# Patient Record
Sex: Male | Born: 1940 | Race: White | Marital: Married | Smoking: Never smoker
Health system: Southern US, Community
[De-identification: ages and names within clinical notes are randomized; demographics above are authoritative.]

## PROBLEM LIST (undated history)

## (undated) DIAGNOSIS — B009 Herpesviral infection, unspecified: Secondary | ICD-10-CM

## (undated) DIAGNOSIS — K219 Gastro-esophageal reflux disease without esophagitis: Secondary | ICD-10-CM

## (undated) DIAGNOSIS — K382 Diverticulum of appendix: Secondary | ICD-10-CM

## (undated) DIAGNOSIS — R911 Solitary pulmonary nodule: Secondary | ICD-10-CM

## (undated) DIAGNOSIS — K648 Other hemorrhoids: Secondary | ICD-10-CM

## (undated) DIAGNOSIS — E871 Hypo-osmolality and hyponatremia: Secondary | ICD-10-CM

## (undated) DIAGNOSIS — I2510 Atherosclerotic heart disease of native coronary artery without angina pectoris: Secondary | ICD-10-CM

## (undated) DIAGNOSIS — K2980 Duodenitis without bleeding: Secondary | ICD-10-CM

## (undated) DIAGNOSIS — R7301 Impaired fasting glucose: Secondary | ICD-10-CM

## (undated) DIAGNOSIS — E559 Vitamin D deficiency, unspecified: Secondary | ICD-10-CM

## (undated) DIAGNOSIS — Z8546 Personal history of malignant neoplasm of prostate: Secondary | ICD-10-CM

## (undated) DIAGNOSIS — C61 Malignant neoplasm of prostate: Secondary | ICD-10-CM

## (undated) DIAGNOSIS — K319 Disease of stomach and duodenum, unspecified: Secondary | ICD-10-CM

## (undated) DIAGNOSIS — E785 Hyperlipidemia, unspecified: Secondary | ICD-10-CM

## (undated) DIAGNOSIS — F419 Anxiety disorder, unspecified: Secondary | ICD-10-CM

## (undated) DIAGNOSIS — I10 Essential (primary) hypertension: Secondary | ICD-10-CM

## (undated) DIAGNOSIS — E039 Hypothyroidism, unspecified: Secondary | ICD-10-CM

## (undated) DIAGNOSIS — K5080 Crohn's disease of both small and large intestine without complications: Secondary | ICD-10-CM

## (undated) DIAGNOSIS — R2681 Unsteadiness on feet: Secondary | ICD-10-CM

## (undated) HISTORY — PX: PROSTATE BIOPSY: SHX241

## (undated) HISTORY — DX: Impaired fasting glucose: R73.01

## (undated) HISTORY — DX: Hypo-osmolality and hyponatremia: E87.1

## (undated) HISTORY — DX: Other hemorrhoids: K64.8

## (undated) HISTORY — DX: Herpesviral infection, unspecified: B00.9

## (undated) HISTORY — DX: Unsteadiness on feet: R26.81

## (undated) HISTORY — DX: Diverticulum of appendix: K38.2

## (undated) HISTORY — DX: Gastro-esophageal reflux disease without esophagitis: K21.9

## (undated) HISTORY — DX: Solitary pulmonary nodule: R91.1

## (undated) HISTORY — DX: Duodenitis without bleeding: K29.80

## (undated) HISTORY — DX: Atherosclerotic heart disease of native coronary artery without angina pectoris: I25.10

## (undated) HISTORY — PX: TONSILLECTOMY: SUR1361

## (undated) HISTORY — DX: Vitamin D deficiency, unspecified: E55.9

## (undated) HISTORY — DX: Disease of stomach and duodenum, unspecified: K31.9

## (undated) HISTORY — PX: PROSTATECTOMY: SHX69

## (undated) HISTORY — DX: Anxiety disorder, unspecified: F41.9

## (undated) HISTORY — DX: Crohn's disease of both small and large intestine without complications: K50.80

## (undated) HISTORY — DX: Essential (primary) hypertension: I10

## (undated) HISTORY — DX: Personal history of malignant neoplasm of prostate: Z85.46

## (undated) HISTORY — DX: Malignant neoplasm of prostate: C61

## (undated) HISTORY — DX: Hyperlipidemia, unspecified: E78.5

---

## 2000-01-29 DIAGNOSIS — K5080 Crohn's disease of both small and large intestine without complications: Secondary | ICD-10-CM

## 2000-01-29 HISTORY — DX: Crohn's disease of both small and large intestine without complications: K50.80

## 2014-05-16 DIAGNOSIS — H2513 Age-related nuclear cataract, bilateral: Secondary | ICD-10-CM | POA: Diagnosis not present

## 2014-05-16 DIAGNOSIS — H40003 Preglaucoma, unspecified, bilateral: Secondary | ICD-10-CM | POA: Diagnosis not present

## 2014-05-16 DIAGNOSIS — H524 Presbyopia: Secondary | ICD-10-CM | POA: Diagnosis not present

## 2014-11-18 DIAGNOSIS — E782 Mixed hyperlipidemia: Secondary | ICD-10-CM | POA: Diagnosis not present

## 2014-11-18 DIAGNOSIS — Z79899 Other long term (current) drug therapy: Secondary | ICD-10-CM | POA: Diagnosis not present

## 2014-11-18 DIAGNOSIS — R5383 Other fatigue: Secondary | ICD-10-CM | POA: Diagnosis not present

## 2014-11-18 DIAGNOSIS — E559 Vitamin D deficiency, unspecified: Secondary | ICD-10-CM | POA: Diagnosis not present

## 2014-11-18 DIAGNOSIS — Z125 Encounter for screening for malignant neoplasm of prostate: Secondary | ICD-10-CM | POA: Diagnosis not present

## 2014-11-18 DIAGNOSIS — Z23 Encounter for immunization: Secondary | ICD-10-CM | POA: Diagnosis not present

## 2014-11-25 DIAGNOSIS — R110 Nausea: Secondary | ICD-10-CM | POA: Diagnosis not present

## 2014-11-25 DIAGNOSIS — E871 Hypo-osmolality and hyponatremia: Secondary | ICD-10-CM | POA: Diagnosis not present

## 2014-11-25 DIAGNOSIS — C61 Malignant neoplasm of prostate: Secondary | ICD-10-CM | POA: Diagnosis not present

## 2014-11-25 DIAGNOSIS — I10 Essential (primary) hypertension: Secondary | ICD-10-CM | POA: Diagnosis not present

## 2014-11-26 DIAGNOSIS — R030 Elevated blood-pressure reading, without diagnosis of hypertension: Secondary | ICD-10-CM | POA: Diagnosis not present

## 2014-11-26 DIAGNOSIS — M545 Low back pain: Secondary | ICD-10-CM | POA: Diagnosis not present

## 2014-11-26 DIAGNOSIS — R42 Dizziness and giddiness: Secondary | ICD-10-CM | POA: Diagnosis not present

## 2014-11-26 DIAGNOSIS — I10 Essential (primary) hypertension: Secondary | ICD-10-CM | POA: Diagnosis not present

## 2014-11-26 DIAGNOSIS — K529 Noninfective gastroenteritis and colitis, unspecified: Secondary | ICD-10-CM | POA: Diagnosis not present

## 2014-11-26 DIAGNOSIS — Z79899 Other long term (current) drug therapy: Secondary | ICD-10-CM | POA: Diagnosis not present

## 2014-11-26 DIAGNOSIS — R110 Nausea: Secondary | ICD-10-CM | POA: Diagnosis not present

## 2014-11-27 DIAGNOSIS — R110 Nausea: Secondary | ICD-10-CM | POA: Diagnosis not present

## 2014-11-27 DIAGNOSIS — M545 Low back pain: Secondary | ICD-10-CM | POA: Diagnosis not present

## 2014-11-27 DIAGNOSIS — R42 Dizziness and giddiness: Secondary | ICD-10-CM | POA: Diagnosis not present

## 2014-11-30 ENCOUNTER — Ambulatory Visit (INDEPENDENT_AMBULATORY_CARE_PROVIDER_SITE_OTHER): Payer: Medicare PPO | Admitting: Internal Medicine

## 2014-11-30 ENCOUNTER — Other Ambulatory Visit (INDEPENDENT_AMBULATORY_CARE_PROVIDER_SITE_OTHER): Payer: Medicare PPO

## 2014-11-30 ENCOUNTER — Encounter: Payer: Self-pay | Admitting: *Deleted

## 2014-11-30 VITALS — BP 160/80 | HR 88 | Ht 70.0 in | Wt 157.6 lb

## 2014-11-30 DIAGNOSIS — Z8719 Personal history of other diseases of the digestive system: Secondary | ICD-10-CM

## 2014-11-30 DIAGNOSIS — F411 Generalized anxiety disorder: Secondary | ICD-10-CM

## 2014-11-30 DIAGNOSIS — R630 Anorexia: Secondary | ICD-10-CM

## 2014-11-30 DIAGNOSIS — R112 Nausea with vomiting, unspecified: Secondary | ICD-10-CM

## 2014-11-30 LAB — CBC WITH DIFFERENTIAL/PLATELET
BASOS ABS: 0 10*3/uL (ref 0.0–0.1)
BASOS PCT: 0.4 % (ref 0.0–3.0)
Eosinophils Absolute: 0.1 10*3/uL (ref 0.0–0.7)
Eosinophils Relative: 0.5 % (ref 0.0–5.0)
HEMATOCRIT: 47.9 % (ref 39.0–52.0)
Hemoglobin: 16 g/dL (ref 13.0–17.0)
LYMPHS ABS: 0.7 10*3/uL (ref 0.7–4.0)
LYMPHS PCT: 6.6 % — AB (ref 12.0–46.0)
MCHC: 33.4 g/dL (ref 30.0–36.0)
MCV: 87.3 fl (ref 78.0–100.0)
MONOS PCT: 10.7 % (ref 3.0–12.0)
Monocytes Absolute: 1.1 10*3/uL — ABNORMAL HIGH (ref 0.1–1.0)
NEUTROS ABS: 8.6 10*3/uL — AB (ref 1.4–7.7)
NEUTROS PCT: 81.8 % — AB (ref 43.0–77.0)
PLATELETS: 307 10*3/uL (ref 150.0–400.0)
RBC: 5.49 Mil/uL (ref 4.22–5.81)
RDW: 13 % (ref 11.5–15.5)
WBC: 10.5 10*3/uL (ref 4.0–10.5)

## 2014-11-30 LAB — HIGH SENSITIVITY CRP: CRP HIGH SENSITIVITY: 1.31 mg/L (ref 0.000–5.000)

## 2014-11-30 LAB — COMPREHENSIVE METABOLIC PANEL
ALBUMIN: 4.6 g/dL (ref 3.5–5.2)
ALK PHOS: 63 U/L (ref 39–117)
ALT: 26 U/L (ref 0–53)
AST: 28 U/L (ref 0–37)
BILIRUBIN TOTAL: 0.4 mg/dL (ref 0.2–1.2)
BUN: 13 mg/dL (ref 6–23)
CALCIUM: 10.1 mg/dL (ref 8.4–10.5)
CO2: 32 mEq/L (ref 19–32)
Chloride: 93 mEq/L — ABNORMAL LOW (ref 96–112)
Creatinine, Ser: 0.81 mg/dL (ref 0.40–1.50)
GFR: 99.42 mL/min (ref >60.00)
GLUCOSE: 113 mg/dL — AB (ref 70–99)
POTASSIUM: 4.8 meq/L (ref 3.5–5.1)
Sodium: 130 mEq/L — ABNORMAL LOW (ref 135–145)
TOTAL PROTEIN: 7.8 g/dL (ref 6.0–8.3)

## 2014-11-30 LAB — CORTISOL: Cortisol, Plasma: 15.4 ug/dL

## 2014-11-30 LAB — LIPASE: Lipase: 41 U/L (ref 11.0–59.0)

## 2014-11-30 LAB — IGA: IgA: 310 mg/dL (ref 68–378)

## 2014-11-30 MED ORDER — CLOTRIMAZOLE-BETAMETHASONE 1-0.05 % EX CREA: 1.0000 "application " | TOPICAL_CREAM | Freq: Two times a day (BID) | CUTANEOUS | Status: DC

## 2014-11-30 MED ORDER — NA SULFATE-K SULFATE-MG SULF 17.5-3.13-1.6 GM/177ML PO SOLN: ORAL | Status: DC

## 2014-11-30 MED ORDER — CLONAZEPAM 0.5 MG PO TABS: 0.5000 mg | ORAL_TABLET | Freq: Three times a day (TID) | ORAL | Status: DC | PRN

## 2014-11-30 MED ORDER — ONDANSETRON 4 MG PO TBDP: 4.0000 mg | ORAL_TABLET | Freq: Three times a day (TID) | ORAL | Status: DC | PRN

## 2014-11-30 NOTE — Progress Notes (Signed)
Patient ID: [name], male   DOB: 03-26-1940, 74 y.o.   MRN: [id number] HPI: Ralph Perkins is a 74 year old male with past medical history of ileocolonic Crohn's disease dating back to diagnosis at time of colonoscopy in 2002 though never symptomatic, hypertension, hyperlipidemia, anxiety, history of prostate cancer status post prostatectomy who is seen to evaluate recent diagnosis of "colitis", nausea, poor appetite. He is here today with his wife. He follows with primary care in [place] by Dr. Lorin Mercy and does not currently have gastroenterology care. Previously in 2000 to Dr. Melina Copa performed a colonoscopy, see below.  He reports that over the last 2 months he has been feeling poorly with somewhat vague symptoms. He describes poor appetite and nausea. He's been having dry heaves but not true vomiting. He's had dizziness. During all this. He reports no issues with bowel habit including no diarrhea or constipation. He denies abdominal pain. He has been forcing himself to be in for this reason has only lost about 2 pounds. He was taken to the ER by ambulance recently. During this time lab analysis was performed which revealed hypo nature anemia. CT scan of the abdomen and pelvis was performed and he was diagnosed with "colitis". He was given a prescription for metronidazole and ciprofloxacin. He took these antibiotics for approximately 2 days and stopped on advice from his primary care provider. He feels that the antibiotics lead to constipation which has never been a problem for him. This caused him to use MiraLAX, E ate grapes and prunes and after this he had diarrhea. He has noticed a red somewhat itchy rash around his umbilicus and in the groin. No mouth ulcers. No ocular complaint. He is active plays tennis frequently though not in the last 2 weeks since he has been feeling poorly.  He reports that he has been extremely anxious and nervous since developing illness. He reports that stress has  always been a problem for him and when he becomes stressed he often is anxious with nervousness and dizziness. He currently expresses being fearful that something is wrong with him such as a malignancy. He also admits to drinking water frequently perhaps overdrinking water while nervous. During our encounter today he asked his wife multiple times for a separate bottle water particularly during stressful moments. He also used Listerine breath spray several times during the encounter which he states helps calm him down and this is more of a nervous habit. Dr. Nicki Reaper gave him Lexapro which she tried for several days but stopped because he felt like symptoms were worsening. He does occasionally use hydroxyzine in the evening for sleep. He takes a stable dose of Synthroid. Lisinopril 20 mg was started recently for hypertension.  2 days ago he developed sharp pain near the right scapula. It has been intermittent and mild today  Family history notable for daughter with Crohn's. His brother had colon polyps.  Past Medical History  Diagnosis Date  . Hyperlipidemia   . Herpes simplex   . Adenocarcinoma of prostate, stage 1 ([place])   . Crohn's ileocolitis ([place]) 2002    Dr Melina Copa  . Anxiety   . Hypertension     Past Surgical History  Procedure Laterality Date  . Prostatectomy    . Tonsillectomy       Allergies  Allergen Reactions  . Penicillins     As a chid    Family History  Problem Relation Age of Onset  . Diabetes Father   . Prostate cancer  Brother     Social History  Substance Use Topics  . Smoking status: Never Smoker   . Smokeless tobacco: Never Used  . Alcohol Use: 0.0 oz/week    0 Standard drinks or equivalent per week     Comment: rare wine    ROS: As per history of present illness, otherwise negative  BP 160/80 mmHg  Pulse 88  Ht 5' 10"  (1.778 m)  Wt 157 lb 9.6 oz (71.487 kg)  BMI 22.61 kg/m2 Constitutional: Well-developed and well-nourished. No distress. HEENT:  Normocephalic and atraumatic. Oropharynx is clear and moist. No oropharyngeal exudate. Conjunctivae are normal.  No scleral icterus. Neck: Neck supple. Trachea midline. Cardiovascular: Normal rate, regular rhythm and intact distal pulses. No M/R/G Pulmonary/chest: Effort normal and breath sounds normal. No wheezing, rales or rhonchi. Abdominal: Soft, nontender, nondistended. Bowel sounds active throughout. There are no masses palpable. No hepatosplenomegaly. Extremities: no clubbing, cyanosis, or edema Lymphadenopathy: No cervical adenopathy noted. Neurological: Alert and oriented to person place and time. Skin: Skin is warm and dry. Periumbilical red rash most consistent with tinea cannot exclude allergic type reaction to nickel as this is near the belt buckle Psychiatric: Normal mood and affect. Behavior is normal though anxious.  RELEVANT LABS AND IMAGING: Labs dated 11/27/2014 -- WBC 8.8, hemoglobin 15.7, MCV 85, platelet count 242, INR 1.0, PTT normal, sodium 1:30, potassium 3.7, BUN 11, creatinine 0.70, glucose 123 Calcium 9.3 Total bili 1.0, alkaline phosphatase 68, AST 25, ALT 29, CK normal, albumin 4.5 TSH, free T4 normal Hgb A1c 6.1 PSA 0.1   CT scan abdomen and pelvis with contrast dated 11/27/2014 --no acute abnormalities. Mild fatty infiltration wall thickening at the terminal ileum may reflect sequela of chronic inflammation. No evidence of acute inflammation. Few tiny nonspecific hypodensities within the liver may reflect small cysts. Of note the pancreas and adrenal glands appeared normal.  CXR -- no edema or consolidation.  Colonoscopy date 02/01/2000 -- multiple shallow irregular oozing ulcers with erythema in the cecum and IC valve. Appeared typical of Crohn's disease of the ileum and cecum. IC valve could not be intubated. Fresh blood could be seen coming from the mouth of the IC valve. Biopsies obtained. Impression: Findings consistent with Crohn's  ileocolitis.  ASSESSMENT/PLAN: 74 year old male with past medical history of ileocolonic Crohn's disease dating back to diagnosis at time of colonoscopy in 2002 though never symptomatic, hypertension, hyperlipidemia, anxiety, history of prostate cancer status post prostatectomy who is seen to evaluate recent diagnosis of "colitis", nausea, poor appetite.  1. Nausea, poor appetite, history of ileocolonic Crohn's disease -- I'm not completely sure the reason for his symptoms. On review of his records I do think he has ileal Crohn's disease as evidenced by the colonoscopy from 2002 and the thickening seen on recent CT of the terminal ileum. Whether or not this is active in the cause of his symptoms is to be determined. He is having upper GI symptoms predominantly at this point and surprisingly no diarrhea or lower GI complaint. There is no evidence of anemia. I recommend upper endoscopy, colonoscopy for further evaluation of his symptoms. This will help exclude gastritis and upper GI involvement by Crohn's. We discussed the risks, benefits and alternatives and he is agreeable to proceed. Abdominal ultrasound to better evaluate and rule out gallstones and gallbladder disease. Repeat labs today to include high sensitivity CRP, celiac panel, lipase, CMP and cortisol level. Zofran 4 mg ODT every 8 hours as needed.  I do not see a need  for antibiotics nor any evidence for colitis. He will not resume Cipro or metronidazole.  2. Anxiety -- considerable issue now though I'm not sure if anxiety is coming from and medical condition that we have yet to elucidate. It is possible that anxiety is driving most of his symptoms as the primary diagnosis but again this is yet to be determined, see #1. I'm going to give him a prescription for clonazepam 0.5 mg 3 times a day when necessary for anxiety. We discussed this medication including the habit forming nature of benzodiazepines. He understands this will not be a long-term  medication for him but it will likely be quite helpful while we complete the workup. Lexapro was started had not tolerated by primary care and if anxiety requires further treatment in the future, another SSRI would be preferable to benzodiazepine  3. History of adrenal problem -- his wife mentioned many years ago just as they were married he was evaluated at [facility] for a possible adrenal problem. This raises the question of a stress hormone secreting tumor. The adrenal glands look normal on recent CT. That said, if the above workup is unrevealing, could consider urine metanephrine measurement and endocrine referral. He is having dizziness, hypertension, nervousness and anxiety all which could relate to stress hormones

## 2014-11-30 NOTE — Patient Instructions (Addendum)
You have been scheduled for an endoscopy and colonoscopy. Please follow the written instructions given to you at your visit today. Please pick up your prep supplies at the pharmacy within the next 1-3 days. If you use inhalers (even only as needed), please bring them with you on the day of your procedure. Your physician has requested that you go to www.startemmi.com and enter the access code given to you at your visit today. This web site gives a general overview about your procedure. However, you should still follow specific instructions given to you by our office regarding your preparation for the procedure.  Your physician has requested that you go to the basement for the following lab work before leaving today: CBC, CMP, Lipase, TTG, IGA, Cortisol, CRP  We have sent the following medications to your pharmacy for you to pick up at your convenience: Clonazepam 0.5 mg three times daily as needed Zofran Lotrisone  You have been scheduled for an abdominal ultrasound at [facility] (1st floor of hospital) on Tuesday, 11/05/14 at 8:30 am. Please arrive 15 minutes prior to your appointment for registration. Make certain not to have anything to eat or drink 6 hours prior to your appointment. Should you need to reschedule your appointment, please contact radiology at [phone]. This test typically takes about 30 minutes to perform.

## 2014-12-01 LAB — TISSUE TRANSGLUTAMINASE, IGA: Tissue Transglutaminase Ab, IgA: 1 U/mL (ref <4)

## 2014-12-02 ENCOUNTER — Telehealth: Payer: Self-pay | Admitting: *Deleted

## 2014-12-02 NOTE — Telephone Encounter (Signed)
He can use it TID and we can increase the dose if needed thereafter

## 2014-12-02 NOTE — Telephone Encounter (Signed)
Per Dr Vena Rua request, I have contacted patient to see how he is doing. 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 I reiterated that he can take this 3 times daily as needed. He has not tried the ODT zofran as he states he has not really needed it recently. I advised for him to keep his scheduled endoscopy/colonoscopy and ultrasound and to call us back should he have worsening symptoms. He verbalizes understanding.

## 2014-12-04 ENCOUNTER — Encounter: Payer: Self-pay | Admitting: Internal Medicine

## 2014-12-06 ENCOUNTER — Ambulatory Visit (HOSPITAL_COMMUNITY)
Admission: RE | Admit: 2014-12-06 | Discharge: 2014-12-06 | Disposition: A | Discharge status: Home / Self Care | Payer: Medicare PPO | Source: Ambulatory Visit | Attending: Internal Medicine | Admitting: Internal Medicine

## 2014-12-06 DIAGNOSIS — R112 Nausea with vomiting, unspecified: Secondary | ICD-10-CM | POA: Insufficient documentation

## 2014-12-06 DIAGNOSIS — Z8719 Personal history of other diseases of the digestive system: Secondary | ICD-10-CM

## 2014-12-07 ENCOUNTER — Ambulatory Visit (AMBULATORY_SURGERY_CENTER): Payer: Medicare PPO | Admitting: Internal Medicine

## 2014-12-07 ENCOUNTER — Encounter: Payer: Self-pay | Admitting: Internal Medicine

## 2014-12-07 VITALS — BP 144/95 | HR 65 | Temp 97.4°F | Resp 29 | Ht 70.0 in | Wt 157.0 lb

## 2014-12-07 DIAGNOSIS — K2980 Duodenitis without bleeding: Secondary | ICD-10-CM | POA: Diagnosis not present

## 2014-12-07 DIAGNOSIS — K2970 Gastritis, unspecified, without bleeding: Secondary | ICD-10-CM | POA: Diagnosis not present

## 2014-12-07 DIAGNOSIS — K5090 Crohn's disease, unspecified, without complications: Secondary | ICD-10-CM | POA: Diagnosis not present

## 2014-12-07 DIAGNOSIS — R112 Nausea with vomiting, unspecified: Secondary | ICD-10-CM | POA: Diagnosis not present

## 2014-12-07 DIAGNOSIS — K269 Duodenal ulcer, unspecified as acute or chronic, without hemorrhage or perforation: Secondary | ICD-10-CM | POA: Diagnosis not present

## 2014-12-07 DIAGNOSIS — Z8719 Personal history of other diseases of the digestive system: Secondary | ICD-10-CM | POA: Diagnosis not present

## 2014-12-07 DIAGNOSIS — K529 Noninfective gastroenteritis and colitis, unspecified: Secondary | ICD-10-CM | POA: Diagnosis not present

## 2014-12-07 DIAGNOSIS — K5000 Crohn's disease of small intestine without complications: Secondary | ICD-10-CM | POA: Diagnosis not present

## 2014-12-07 DIAGNOSIS — K3189 Other diseases of stomach and duodenum: Secondary | ICD-10-CM | POA: Diagnosis not present

## 2014-12-07 MED ORDER — PANTOPRAZOLE SODIUM 40 MG PO TBEC: 40.0000 mg | DELAYED_RELEASE_TABLET | Freq: Every day | ORAL | Status: DC

## 2014-12-07 MED ORDER — HYDROCORTISONE ACETATE 25 MG RE SUPP: RECTAL | Status: DC

## 2014-12-07 MED ORDER — SODIUM CHLORIDE 0.9 % IV SOLN: 500.0000 mL | INTRAVENOUS | Status: DC

## 2014-12-07 NOTE — Progress Notes (Signed)
Called to room to assist during endoscopic procedure.  Patient ID and intended procedure confirmed with present staff. Received instructions for my participation in the procedure from the performing physician.  

## 2014-12-07 NOTE — Op Note (Signed)
[place]  [address]. [place], [address]   COLONOSCOPY PROCEDURE REPORT  PATIENT: Ralph Perkins, Degregorio  MR#: [id number] BIRTHDATE: 03-Oct-1940 , 15  yrs. old GENDER: male ENDOSCOPIST: Jerene Bears, MD PROCEDURE DATE:  12/07/2014 PROCEDURE:   Colonoscopy, diagnostic and Colonoscopy with biopsy First Screening Colonoscopy - Avg.  risk and is 50 yrs.  old or older - No.  Prior Negative Screening - Now for repeat screening. N/A  History of Adenoma - Now for follow-up colonoscopy & has been > or = to 3 yrs.  N/A  Polyps removed today? No ASA CLASS:   Class II INDICATIONS:nausea, vomiting, and history of Crohn's ileitis diagnosed at screening colonoscopy in 2002. MEDICATIONS: Propofol 350 mg IV, Monitored anesthesia care, and this was the total dose used for all procedures at this session  DESCRIPTION OF PROCEDURE:   After the risks benefits and alternatives of the procedure were thoroughly explained, informed consent was obtained.  The digital rectal exam revealed no rectal mass.   The LB [id number]  endoscope was introduced through the anus and advanced to the terminal ileum which was intubated for a short distance. No adverse events experienced.   The quality of the prep was good.  (Suprep was used)  The instrument was then slowly withdrawn as the colon was fully examined. Estimated blood loss is zero unless otherwise noted in this procedure report.      COLON FINDINGS: Ileitis with exudate and narrowing at the IC valve. Due to the narrowing the scope was not able to fully enter the terminal ileum.  Multiple biopsies were taken at this site.   Very scattered and very mild erythema and erosions throughout the colon; multiple biopsies to evaluate for Crohn's disease.   No polyps or tumors seen.   Diverticulosis present in the sigmoid colon and distal descending colon which is mild.   Retroflexion revealed moderate to large internal hemorrhoids, one of which  with overlying ulceration.  On palpation this is lesion is soft.    The time to cecum = 4.2 Withdrawal time = 15.2   The scope was withdrawn and the procedure completed.  COMPLICATIONS: There were no immediate complications.    ENDOSCOPIC IMPRESSION: 1.   Very focal ileitis with exudate and narrowing at the IC valve. Due to the narrowing the scope was not able to fully enter the terminal ileum.  Multiple biopsies were taken at this site 2.   Very scattered and very mild erythema with erosions throughout the colon; multiple biopsies to evaluate for Crohn's disease 3.   No polyps or tumors seen 4.   Diverticulosis present in the sigmoid colon and distal descending colon which is mild 5.   Retroflexion revealed moderate to large internal hemorrhoids, one of which with overlying ulceration.  RECOMMENDATIONS: 1.  Await biopsy results 2.  Hydrocortisone suppository 25 mg twice a day -5 days 3.  Further recommendations after biopsy results available  eSigned:  Jerene Bears, MD 12/07/2014 9:11 AM   cc:  the patient, PCP   PATIENT NAME:  Ralph Perkins, [name] MR#: [id number]

## 2014-12-07 NOTE — Progress Notes (Signed)
A/ox3 pleased with MAC, report to Penny RN 

## 2014-12-07 NOTE — Patient Instructions (Addendum)
YOU HAD AN ENDOSCOPIC PROCEDURE TODAY AT [facility]:   Refer to the procedure report that was given to you for any specific questions about what was found during the examination.  If the procedure report does not answer your questions, please call your gastroenterologist to clarify.  If you requested that your care partner not be given the details of your procedure findings, then the procedure report has been included in a sealed envelope for you to review at your convenience later.  YOU SHOULD EXPECT: Some feelings of bloating in the abdomen. Passage of more gas than usual.  Walking can help get rid of the air that was put into your GI tract during the procedure and reduce the bloating. If you had a lower endoscopy (such as a colonoscopy or flexible sigmoidoscopy) you may notice spotting of blood in your stool or on the toilet paper. If you underwent a bowel prep for your procedure, you may not have a normal bowel movement for a few days.  Please Note:  You might notice some irritation and congestion in your nose or some drainage.  This is from the oxygen used during your procedure.  There is no need for concern and it should clear up in a day or so.  SYMPTOMS TO REPORT IMMEDIATELY:   Following lower endoscopy (colonoscopy or flexible sigmoidoscopy):  Excessive amounts of blood in the stool  Significant tenderness or worsening of abdominal pains  Swelling of the abdomen that is new, acute  Fever of 100F or higher   Following upper endoscopy (EGD)  Vomiting of blood or coffee ground material  New chest pain or pain under the shoulder blades  Painful or persistently difficult swallowing  New shortness of breath  Fever of 100F or higher  Black, tarry-looking stools  For urgent or emergent issues, a gastroenterologist can be reached at any hour by calling [phone].   DIET: Your first meal following the procedure should be a small meal and then it is ok to progress to  your normal diet. Heavy or fried foods are harder to digest and may make you feel nauseous or bloated.  Likewise, meals heavy in dairy and vegetables can increase bloating.  Drink plenty of fluids but you should avoid alcoholic beverages for 24 hours.  ACTIVITY:  You should plan to take it easy for the rest of today and you should NOT DRIVE or use heavy machinery until tomorrow (because of the sedation medicines used during the test).    FOLLOW UP: Our staff will call the number listed on your records the next business day following your procedure to check on you and address any questions or concerns that you may have regarding the information given to you following your procedure. If we do not reach you, we will leave a message.  However, if you are feeling well and you are not experiencing any problems, there is no need to return our call.  We will assume that you have returned to your regular daily activities without incident.  If any biopsies were taken you will be contacted by phone or by letter within the next 1-3 weeks.  Please call us at [phone] if you have not heard about the biopsies in 3 weeks.    SIGNATURES/CONFIDENTIALITY: You and/or your care partner have signed paperwork which will be entered into your electronic medical record.  These signatures attest to the fact that that the information above on your After Visit Summary has been reviewed  and is understood.  Full responsibility of the confidentiality of this discharge information lies with you and/or your care-partner.   Information on polyps,diverticulosis ,hemorrhoids,& high fiber diet given to you today  Await pathology results  2 new medications( see med list above ) SENT TO YOUR PHARMACY

## 2014-12-07 NOTE — Op Note (Signed)
[place]  [address]. [place], [address]   ENDOSCOPY PROCEDURE REPORT  PATIENT: Ralph Perkins, Ralph Perkins  MR#: [id number] BIRTHDATE: 12-18-1940 , 89  yrs. old GENDER: male ENDOSCOPIST: Jerene Bears, MD PROCEDURE DATE:  12/07/2014 PROCEDURE:  EGD, diagnostic and EGD w/ biopsy ASA CLASS:     Class II INDICATIONS:  nausea, vomiting, and history of Crohn's of the terminal ileum. MEDICATIONS: Monitored anesthesia care, this was the total dose used for all procedures at this session, and Propofol 350 mg IV TOPICAL ANESTHETIC: none  DESCRIPTION OF PROCEDURE: After the risks benefits and alternatives of the procedure were thoroughly explained, informed consent was obtained.  The LB [id number] endoscope was introduced through the mouth and advanced to the second portion of the duodenum , Without limitations.  The instrument was slowly withdrawn as the mucosa was fully examined.   ESOPHAGUS: Small inlet patch at 18 cm with no nodularity.   Mild reflux esophagitis was found at the gastroesophageal junction.  No evidence of Barrett's esophagus.  STOMACH: Moderate gastritis (inflammation) was found in the prepyloric region of the stomach and gastric antrum.  Cold forcep biopsies were taken at the gastric body, antrum and angularis to evaluate for h.  pylori.  The proximal stomach was unremarkable.  DUODENUM: A single non-bleeding and clean-based ulcer was found in the duodenal bulb.  Biopsies were taken around the ulcer and in the ara of duodenitis.   Moderate duodenal inflammation was found in the duodenal bulb.   A large diverticulum was found in the peri-ampullary area.  The 2nd portion of the duodenum was otherwise unremarkable.  Retroflexed views revealed no abnormalities.     The scope was then withdrawn from the patient and the procedure completed.  COMPLICATIONS: There were no immediate complications.    ENDOSCOPIC IMPRESSION: 1.   Small inlet patch at  18 cm with no nodularity 2.   Mild reflux esophagitis at the gastroesophageal junction 3.   Gastritis (inflammation) was found in the prepyloric region of the stomach and gastric antrum; multiple biopsies 4.   Single ulcer was found in the duodenal bulb; biopsies were taken 5.   Duodenal inflammation was found in the duodenal bulb; biopsies were taken 6.   Diverticulum was found in the peri-ampullary area 7.   Otherwise normal examined 2nd portion of the duodenum  RECOMMENDATIONS: 1.  Await biopsy results 2.  Begin pantoprazole 40 mg daily 3.  Proceed with a Colonoscopy.  eSigned:  Jerene Bears, MD 12/07/2014 9:04 AM    CC: the patient, PCP  PATIENT NAME:  [name], Ace MR#: [id number]

## 2014-12-07 NOTE — Progress Notes (Signed)
[facility] advisory given to patient

## 2014-12-08 ENCOUNTER — Telehealth: Payer: Self-pay | Admitting: *Deleted

## 2014-12-08 NOTE — Telephone Encounter (Signed)

## 2014-12-12 ENCOUNTER — Encounter: Payer: Self-pay | Admitting: Internal Medicine

## 2014-12-13 NOTE — Telephone Encounter (Signed)
Spoke to patient by phone for follow-up and regarding pathology results Gastric biopsies showed gastropathy without H. 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 Still with anxiety. He is having pain under his right scapula which is worse with movement and with deep inspiration, particularly when exercising. Pain does not radiate. No dyspnea or chest pain. No belching. Pain has been present about one week before procedures. He wonders if this could be musculoskeletal. As he was worried about his overall health he increased his exercise as a stress reliever. He was doing 150 pushups per day and wonders if he did not injure a muscle. Overall this is slightly better over the past week than the week before. Bowel movements a been regular without blood in his stool or melena. He denies abdominal pain. He is taking pantoprazole 40 mg daily He is using clonazepam 0.5 mg 3 times a day when necessary. This is helping  For now I have advised that he continue pantoprazole 40 mg daily Avoid NSAIDs Use clonazepam as needed for anxiety for now, eventually will likely benefit from SSRI if symptoms fail to improve Follow-up with me in about 6 weeks Discuss like sig at follow-up to evaluate anal ulceration seen on retroflexion Contact primary care or orthopedics if subscapular pain does not improve He voiced understanding, thanked me for the call. Our phone discussion is in lieu of pathology letter

## 2014-12-14 ENCOUNTER — Encounter: Payer: Self-pay | Admitting: Internal Medicine

## 2014-12-14 DIAGNOSIS — M549 Dorsalgia, unspecified: Secondary | ICD-10-CM | POA: Diagnosis not present

## 2014-12-29 ENCOUNTER — Encounter: Payer: Self-pay | Admitting: *Deleted

## 2015-01-17 ENCOUNTER — Other Ambulatory Visit (INDEPENDENT_AMBULATORY_CARE_PROVIDER_SITE_OTHER): Payer: Medicare PPO

## 2015-01-17 ENCOUNTER — Ambulatory Visit (INDEPENDENT_AMBULATORY_CARE_PROVIDER_SITE_OTHER): Payer: Medicare PPO | Admitting: Internal Medicine

## 2015-01-17 ENCOUNTER — Encounter: Payer: Self-pay | Admitting: Internal Medicine

## 2015-01-17 VITALS — BP 160/100 | HR 78 | Wt 162.5 lb

## 2015-01-17 DIAGNOSIS — K2970 Gastritis, unspecified, without bleeding: Secondary | ICD-10-CM | POA: Diagnosis not present

## 2015-01-17 DIAGNOSIS — K5080 Crohn's disease of both small and large intestine without complications: Secondary | ICD-10-CM | POA: Diagnosis not present

## 2015-01-17 DIAGNOSIS — F411 Generalized anxiety disorder: Secondary | ICD-10-CM

## 2015-01-17 DIAGNOSIS — R110 Nausea: Secondary | ICD-10-CM | POA: Diagnosis not present

## 2015-01-17 DIAGNOSIS — K5090 Crohn's disease, unspecified, without complications: Secondary | ICD-10-CM

## 2015-01-17 DIAGNOSIS — B354 Tinea corporis: Secondary | ICD-10-CM

## 2015-01-17 DIAGNOSIS — F419 Anxiety disorder, unspecified: Secondary | ICD-10-CM

## 2015-01-17 DIAGNOSIS — K2990 Gastroduodenitis, unspecified, without bleeding: Secondary | ICD-10-CM

## 2015-01-17 LAB — COMPREHENSIVE METABOLIC PANEL
ALBUMIN: 4.4 g/dL (ref 3.5–5.2)
ALK PHOS: 58 U/L (ref 39–117)
ALT: 18 U/L (ref 0–53)
AST: 15 U/L (ref 0–37)
BUN: 17 mg/dL (ref 6–23)
CALCIUM: 9.9 mg/dL (ref 8.4–10.5)
CHLORIDE: 96 meq/L (ref 96–112)
CO2: 30 mEq/L (ref 19–32)
Creatinine, Ser: 0.86 mg/dL (ref 0.40–1.50)
GFR: 92.75 mL/min (ref >60.00)
Glucose, Bld: 106 mg/dL — ABNORMAL HIGH (ref 70–99)
POTASSIUM: 4.4 meq/L (ref 3.5–5.1)
SODIUM: 134 meq/L — AB (ref 135–145)
TOTAL PROTEIN: 7.2 g/dL (ref 6.0–8.3)
Total Bilirubin: 0.5 mg/dL (ref 0.2–1.2)

## 2015-01-17 LAB — VITAMIN B12: VITAMIN B 12: 265 pg/mL (ref 211–911)

## 2015-01-17 MED ORDER — CLONAZEPAM 0.5 MG PO TABS: 0.5000 mg | ORAL_TABLET | Freq: Two times a day (BID) | ORAL | Status: DC

## 2015-01-17 NOTE — Progress Notes (Signed)
Subjective:    Patient ID: Ralph Blalock., male    DOB: 1940/01/30, 74 y.o.   MRN: [id number]  HPI Ralph Perkins is a 74 year old male with past medical history of ileocolonic Crohn's disease, peptic duodenitis, anxiety, hypertension, hyperlipidemia, history of prostate cancer status post prostatectomy is here for follow-up. He is here with his wife. He was initially seen on 11/30/2014. After this visit and abdominal ultrasound was performed which showed no gallstones, prominence of the common bile duct 11.1 mm. No obstructing lesion was seen. Upper endoscopy was performed on 12/07/2014. This showed a small inlet patch. Mild reflux esophagitis but no evidence of Barrett's esophagus. There was moderate inflammation in the prepyloric stomach and gastric antrum. This was biopsied. A clean-based ulceration was found in the duodenal bulb with surrounding duodenitis. A large diverticulum was found in the periampullary area. Distal to the diverticulum the examined duodenum appeared normal. Colonoscopy performed on the same day showed ileitis with exudate and narrowing at the IC valve. There was scattered erythema throughout the colon with erosions. No polyps or tumors were seen. Diverticulosis was present in the left colon and mild. There were moderate to large internal hemorrhoids 1 with overlying ulceration. Biopsies from the ileum showed chronic inactive IBD. Random colon biopsies showed chronic focal active IBD. Duodenal bulb biopsy showed peptic duodenitis. In gastric biopsies showed reactive gastropathy without H. pylori. He was started on pantoprazole 40 mg daily as well as clonazepam 0.5 3 times a day when necessary for anxiety. Lotrisone started for tinea of the groin and perianal skin. Lab work revealed normal LFTs, negative celiac panel, normal CRP, chronically mildly low sodium, a normal serum cortisol.  Today he reports that he is doing significantly better than when we first met. He is still having  some very mild nausea from time to time but it is short lived. He is not using Zofran for this as Zofran had caused some constipation. He sucking on hard candy which seems to help nausea. He's noticed some slight improved appetite and been able to gain 5 pounds. Nausea seems to get a little better with eating. Bowel movements have remained very regular once per day without blood in his stool or melena. No abdominal pain. Anxiety is getting a little better he's no longer "trapped" in the house. He's exercising. Still avoiding going out to dinner with friends. Using clonazepam initially 0.5 mg now 0.25 mg approximately once per day. It does help. Previously he tried Lexapro started by primary care but felt like this worsened his GI symptoms.  Review of Systems As per history of present illness, otherwise negative  Current Medications, Allergies, Past Medical History, Past Surgical History, Family History and Social History were reviewed in [employer] record.     Objective:   Physical Exam BP 160/100 mmHg  Pulse 78  Wt 162 lb 8 oz (73.71 kg) Constitutional: Well-developed and well-nourished. No distress. HEENT: Normocephalic and atraumatic. Oropharynx is clear and moist. No oropharyngeal exudate. Conjunctivae are normal.  No scleral icterus. Neck: Neck supple. Trachea midline. Cardiovascular: Normal rate, regular rhythm and intact distal pulses. No M/R/G Pulmonary/chest: Effort normal and breath sounds normal. No wheezing, rales or rhonchi. Abdominal: Soft, nontender, nondistended. Bowel sounds active throughout. There are no masses palpable. No hepatosplenomegaly. Extremities: no clubbing, cyanosis, or edema Lymphadenopathy: No cervical adenopathy noted. Neurological: Alert and oriented to person place and time. Skin: Skin is warm and dry. No rashes noted. Psychiatric: Normal mood and affect. Behavior  is normal.  Labs, endoscopies and ultrasound reviewed today including  with the patient     Assessment & Plan:  74 year old male with past medical history of ileocolonic Crohn's disease, peptic duodenitis, anxiety, hypertension, hyperlipidemia, history of prostate cancer status post prostatectomy is here for follow-up.  1.  Gastritis and duodenitis -- likely the cause of nausea and poor appetite. Improving with PPI. Continue pantoprazole 40 mg daily for now. Avoid NSAIDs, which she does already. He can liberalize his diet some and I'm okay with 1-2 alcohol drinks per day on occasion. No evidence of Crohn's disease in the upper GI tract by biopsy. No evidence for H. Pylori  2. Ileocolonic Crohn's -- focally active by biopsy, inactive in the terminal ileum. This is a long-standing process for him and fortunately he has been asymptomatic. Given his sensitivity to medications throughout the years and also Perkins of symptoms, I'm not going to start a Crohn's therapy at present. We discussed this at length, he understands and agrees. I've asked him to notify me of any change in bowel symptoms and he voices understanding -Check B12 today  3. Anxiety -- considerable issue for him which he understands. He is doing better. SSRI would likely be very beneficial for him for the long-term. For now we are using clonazepam as he continues to improve from an overall GI perspective. He is doing better on lower dose clonazepam and he could use 0.25 mg twice daily on a scheduled basis for now. We discussed SSRI and eventually this class of medication would likely be better for him than benzodiazepine, as I expect he will need long-term therapy. He does seem to be improving is getting out more and exercising. I encouraged continued exercise  4. Tinea corporis -- improving with Lotrisone. I'm going to remove the steroid component and he can use over-the-counter clotrimazole solely on an as-needed basis.  5. History of vitamin D deficiency -- on 5000 units daily, check vitamin D today. If normal  will plan to decrease to 2000 units daily  6. Back/shoulder pain -- better with rest, improved musculoskeletal injury  3 month follow-up, sooner if necessary 40 minutes spent with the patient today. Greater than 50% was spent in counseling and coordination of care with the patient

## 2015-01-17 NOTE — Patient Instructions (Signed)
Your physician has requested that you go to the basement for the following lab work before leaving today: Vitamin D, CMET, B12  We have sent the following medications to your pharmacy for you to pick up at your convenience: Clonazepam- Take 0.5 tablet by mouth twice daily   Continue Protonix 40 mg daily.  Change from Lotrisone cream to plain clotrimazole (OTC)  Follow up with Dr Hilarie Fredrickson in 3 months.

## 2015-01-21 LAB — VITAMIN D 1,25 DIHYDROXY
Vitamin D 1, 25 (OH)2 Total: 65 pg/mL (ref 18–72)
Vitamin D3 1, 25 (OH)2: 65 pg/mL

## 2015-05-22 ENCOUNTER — Ambulatory Visit (INDEPENDENT_AMBULATORY_CARE_PROVIDER_SITE_OTHER): Payer: Medicare Other | Admitting: Internal Medicine

## 2015-05-22 ENCOUNTER — Other Ambulatory Visit (INDEPENDENT_AMBULATORY_CARE_PROVIDER_SITE_OTHER): Payer: Medicare Other

## 2015-05-22 ENCOUNTER — Encounter: Payer: Self-pay | Admitting: Internal Medicine

## 2015-05-22 VITALS — HR 80 | Ht 70.0 in | Wt 170.6 lb

## 2015-05-22 DIAGNOSIS — E538 Deficiency of other specified B group vitamins: Secondary | ICD-10-CM

## 2015-05-22 DIAGNOSIS — E871 Hypo-osmolality and hyponatremia: Secondary | ICD-10-CM

## 2015-05-22 DIAGNOSIS — K2970 Gastritis, unspecified, without bleeding: Secondary | ICD-10-CM

## 2015-05-22 DIAGNOSIS — K5000 Crohn's disease of small intestine without complications: Secondary | ICD-10-CM

## 2015-05-22 DIAGNOSIS — F411 Generalized anxiety disorder: Secondary | ICD-10-CM

## 2015-05-22 DIAGNOSIS — K648 Other hemorrhoids: Secondary | ICD-10-CM

## 2015-05-22 DIAGNOSIS — G479 Sleep disorder, unspecified: Secondary | ICD-10-CM

## 2015-05-22 DIAGNOSIS — E559 Vitamin D deficiency, unspecified: Secondary | ICD-10-CM

## 2015-05-22 DIAGNOSIS — K2990 Gastroduodenitis, unspecified, without bleeding: Secondary | ICD-10-CM

## 2015-05-22 LAB — CBC WITH DIFFERENTIAL/PLATELET
BASOS ABS: 0 10*3/uL (ref 0.0–0.1)
BASOS PCT: 0.4 % (ref 0.0–3.0)
EOS PCT: 0.4 % (ref 0.0–5.0)
Eosinophils Absolute: 0 10*3/uL (ref 0.0–0.7)
HEMATOCRIT: 45.7 % (ref 39.0–52.0)
Hemoglobin: 15.3 g/dL (ref 13.0–17.0)
LYMPHS ABS: 0.9 10*3/uL (ref 0.7–4.0)
Lymphocytes Relative: 10.3 % — ABNORMAL LOW (ref 12.0–46.0)
MCHC: 33.5 g/dL (ref 30.0–36.0)
MCV: 87.7 fl (ref 78.0–100.0)
MONOS PCT: 9.8 % (ref 3.0–12.0)
Monocytes Absolute: 0.9 10*3/uL (ref 0.1–1.0)
NEUTROS ABS: 7 10*3/uL (ref 1.4–7.7)
NEUTROS PCT: 79.1 % — AB (ref 43.0–77.0)
PLATELETS: 268 10*3/uL (ref 150.0–400.0)
RBC: 5.21 Mil/uL (ref 4.22–5.81)
RDW: 13.8 % (ref 11.5–15.5)
WBC: 8.8 10*3/uL (ref 4.0–10.5)

## 2015-05-22 LAB — BASIC METABOLIC PANEL
BUN: 16 mg/dL (ref 6–23)
CHLORIDE: 99 meq/L (ref 96–112)
CO2: 27 mEq/L (ref 19–32)
CREATININE: 0.86 mg/dL (ref 0.40–1.50)
Calcium: 9.7 mg/dL (ref 8.4–10.5)
GFR: 92.66 mL/min (ref >60.00)
GLUCOSE: 105 mg/dL — AB (ref 70–99)
Potassium: 3.9 mEq/L (ref 3.5–5.1)
Sodium: 135 mEq/L (ref 135–145)

## 2015-05-22 LAB — VITAMIN D 25 HYDROXY (VIT D DEFICIENCY, FRACTURES): VITD: 27.51 ng/mL — AB (ref 30.00–100.00)

## 2015-05-22 LAB — TSH: TSH: 1.09 u[IU]/mL (ref 0.35–4.50)

## 2015-05-22 LAB — CORTISOL: Cortisol, Plasma: 13 ug/dL

## 2015-05-22 LAB — VITAMIN B12: Vitamin B-12: 334 pg/mL (ref 211–911)

## 2015-05-22 MED ORDER — CLONAZEPAM 0.5 MG PO TABS: 0.5000 mg | ORAL_TABLET | Freq: Two times a day (BID) | ORAL | Status: DC

## 2015-05-22 MED ORDER — PANTOPRAZOLE SODIUM 40 MG PO TBEC: 40.0000 mg | DELAYED_RELEASE_TABLET | Freq: Every day | ORAL | Status: DC

## 2015-05-22 NOTE — Patient Instructions (Signed)
We have sent the following medications to your pharmacy for you to pick up at your convenience: Klonopin Pantoprazole  Your physician has requested that you go to the basement for the following lab work before leaving today: TSH, Cortisol, BMP, Vitamin D, B12, CBC  Continue exercise!  Follow up with Dr Hilarie Fredrickson in 9 months.  If you are age 73 or older, your body mass index should be between 23-30. Your Body mass index is 24.48 kg/(m^2). If this is out of the aforementioned range listed, please consider follow up with your Primary Care Provider.  If you are age 39 or younger, your body mass index should be between 19-25. Your Body mass index is 24.48 kg/(m^2). If this is out of the aformentioned range listed, please consider follow up with your Primary Care Provider.

## 2015-05-22 NOTE — Progress Notes (Signed)
Subjective:    Patient ID: Ralph Perkins., male    DOB: 1940-09-27, 75 y.o.   MRN: [id number]  HPI Ralph Perkins is a 75 year old male with a history of ileal Crohn's disease, peptic duodenitis, low B12, vitamin D deficiency, anxiety, remote prostate cancer status post prostatectomy, hypertension and hyperlipidemia who is here for follow-up. He is here with his wife today. He was last seen in the office on 01/17/2015. He reports that recently he has been feeling much better. He's had less issues with his anxiety and he has started exercising and playing tennis again. He is now walking 5 miles 7 days a week with his wife. He is also play tennis on several occasions. He denies any issues with abdominal pain, nausea or vomiting. Appetite has improved. He has been able to gain weight. No issues with diarrhea, constipation, blood in his stool or melena. No issues with defecation or passing stool. No perianal pain. He does have issues with waking up several times throughout the night but has no issues initiating sleep. He was trying hydroxyzine for sleep but this left him with a "hangover" in the morning. There are times when he wakes up about 4 AM feeling chilled but without rigors or shaking, fever. No night sweats. This is been a long-standing issue. Anxiety has been improved and he will rarely use clonazepam 0.5 mg which he is cutting in half. He does continue pantoprazole 40 mg daily without issue.   Review of Systems As per history of present illness, otherwise negative  25 minutes spent with the patient today. Greater than 50% was spent in counseling and coordination of care with the patient     Objective:   Physical Exam BP   Pulse 80  Ht 5' 10"  (1.778 m)  Wt 170 lb 9.6 oz (77.384 kg)  BMI 24.48 kg/m2 Constitutional: Well-developed and well-nourished. No distress. HEENT: Normocephalic and atraumatic. Oropharynx is clear and moist. No oropharyngeal exudate. Conjunctivae are normal.  No  scleral icterus. Neck: Neck supple. Trachea midline. Cardiovascular: Normal rate, regular rhythm and intact distal pulses. No M/R/G Pulmonary/chest: Effort normal and breath sounds normal. No wheezing, rales or rhonchi. Abdominal: Soft, nontender, nondistended. Bowel sounds active throughout. There are no masses palpable. No hepatosplenomegaly. Extremities: no clubbing, cyanosis, or edema Lymphadenopathy: No cervical adenopathy noted. Neurological: Alert and oriented to person place and time. Skin: Skin is warm and dry. No rashes noted. Psychiatric: Normal mood and affect. Behavior is normal.  Labs and procedures reviewed today including with the patient     Assessment & Plan:  75 year old male with a history of ileal Crohn's disease, peptic duodenitis, low B12, vitamin D deficiency, anxiety, remote prostate cancer status post prostatectomy, hypertension and hyperlipidemia who is here for follow-up.  1. Gastroduodenitis -- much improved symptomatically with resolution of nausea, emesis and poor appetite. We'll continue pantoprazole 40 mg daily after discussion of the risks, benefits and alternatives. I do feel this medication is responsible for improvement in his gastritis and duodenitis symptoms. H. pylori was negative  2. Ileocolonic Crohn's disease -- acted by biopsy but without symptoms. This is very long-standing process for him. He has been hypersensitive to medication started in the past for IBD. After discussion and based on overall clinical well being, decision made not to begin Crohn's therapy at present. He will notify me of any abdominal pain, change in bowel habits or potential bowel symptoms going forward. He is happy with this plan  3. Internal hemorrhoids --  asymptomatic. We discussed the findings on retroflexion at the time of colonoscopy in November 2016. This showed an internal hemorrhoid with overlying ulceration. He's had no symptom of internal hemorrhoid and has no perianal  pain. We discussed possible flexible sigmoidoscopy for reexamination of this area but given the lack of symptoms the decision was made not to proceed. He is in agreement and very comfortable with this decision. He is asked to notify me should he develop perianal pain or any rectal bleeding. He voices understanding  4. Anxiety -- chronic but improving. He is getting out of the house to do social engagements, went to the beach with his family, is walking daily with his wife and even playing tennis again. At this point he can use clonazepam 0.25-0.5 milligrams 2-3 times on an as-needed basis. Given his overall improvement and control of symptoms I'm not convinced SSRI as needed at this time. We will continue to evaluate this going forward and in the future he may need SSRI which he is aware of.  5. Tinea corporis -- resolved. Discontinue Lotrisone  6. Vitamin D deficiency -- repeat vitamin D level, continue vitamin D3 2000 units daily  7. 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 This has improved. BMP today  8. Sleep disturbance -- no trouble with sleep initiation that he is waking up at night. Try melatonin 2-3 mg daily at bedtime  9 month follow-up, sooner if necessary Labs today to include BMP, CBC, B12, cortisol, vitamin D, TSH 45 minutes spent with the patient today. Greater than 50% was spent in counseling and coordination of care with the patient

## 2015-10-15 ENCOUNTER — Other Ambulatory Visit: Payer: Self-pay | Admitting: Internal Medicine

## 2015-12-15 ENCOUNTER — Telehealth: Payer: Self-pay | Admitting: Internal Medicine

## 2015-12-15 MED ORDER — PANTOPRAZOLE SODIUM 40 MG PO TBEC: 40.0000 mg | DELAYED_RELEASE_TABLET | Freq: Every day | ORAL | 1 refills | Status: DC

## 2015-12-15 NOTE — Telephone Encounter (Signed)
Wife notified that patient is not due for labs.  He should remain on pantoprazole.  New RX sent.  All questions answered.  She will call back for any additional questions or concerns.

## 2015-12-19 IMAGING — US US ABDOMEN COMPLETE
1 series · 14 of 25 positions shown · non-contrast
Comparison: CT 11/27/2014.

CLINICAL DATA: Nausea and vomiting.

EXAM:
ULTRASOUND ABDOMEN COMPLETE

[Series 1: us abdomen complete · 0.18mm/px · 14 of 132 slices shown]
[im 1/132]
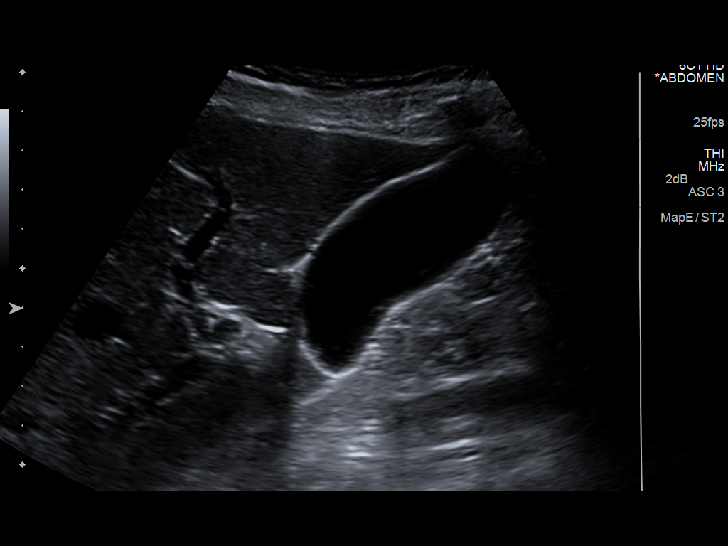
[im 11/132]
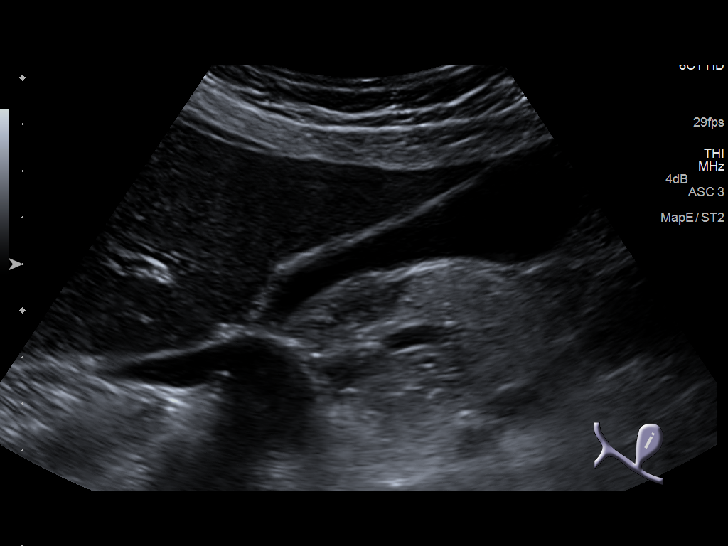
[im 22/132]
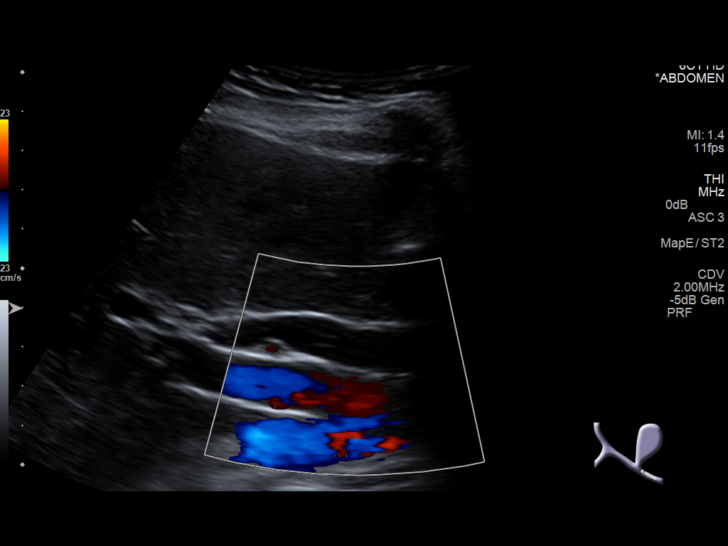
[im 33/132]
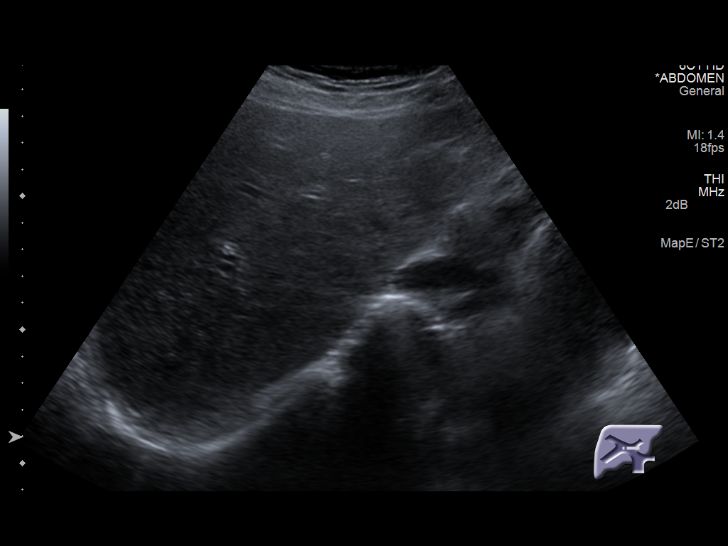
[im 44/132]
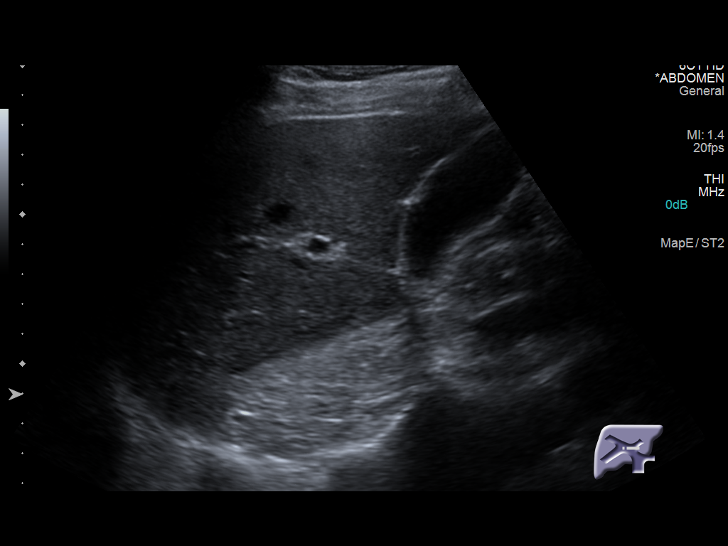
[im 50/132]
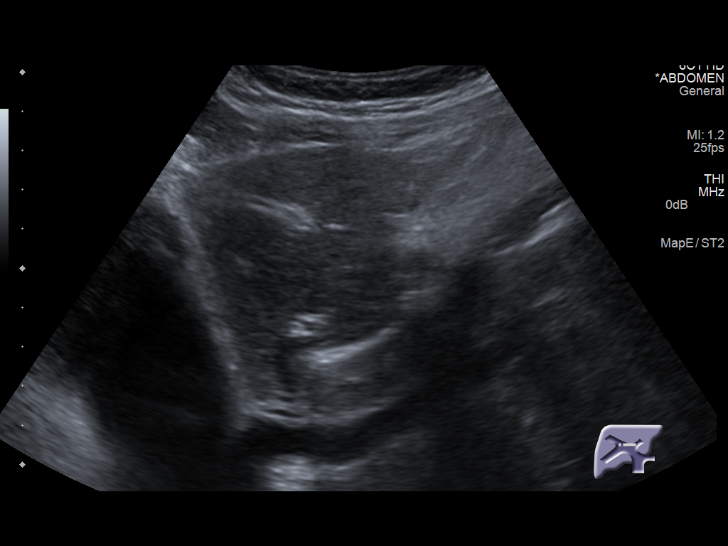
[im 61/132]
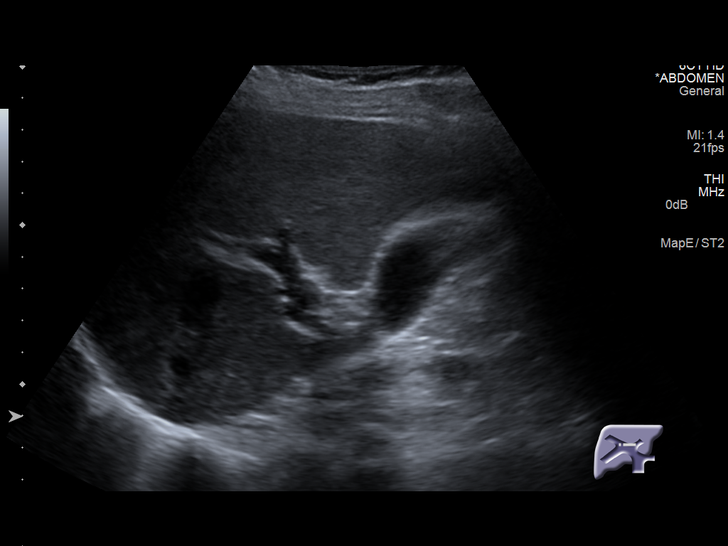
[im 71/132]
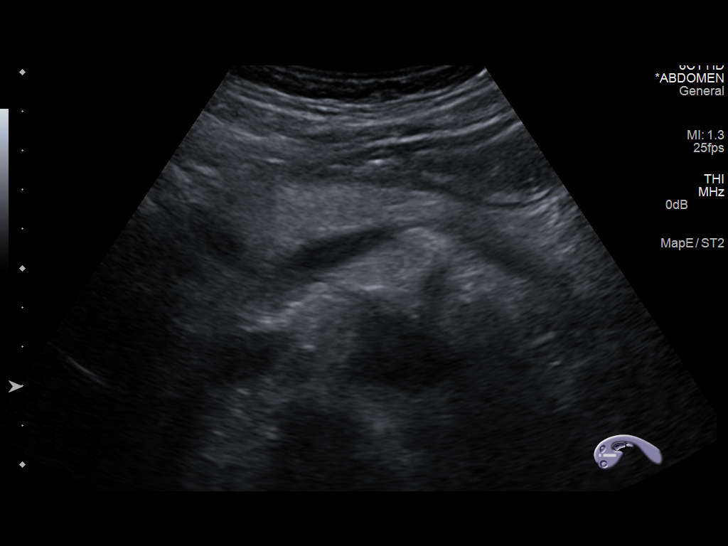
[im 82/132]
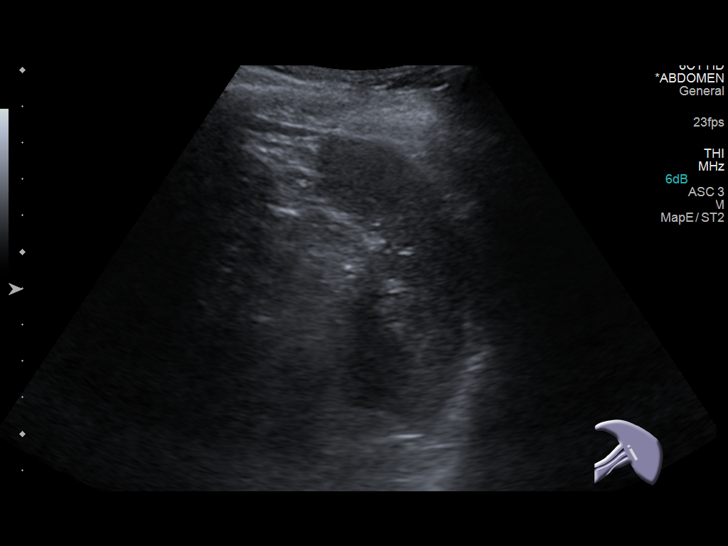
[im 88/132]
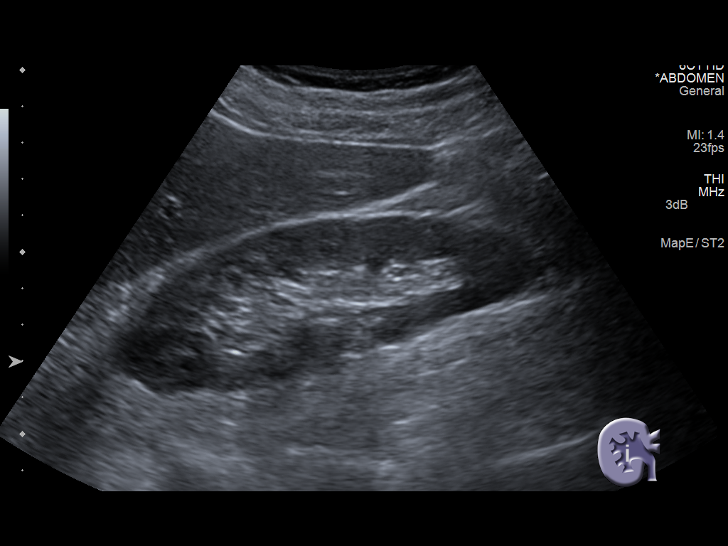
[im 99/132]
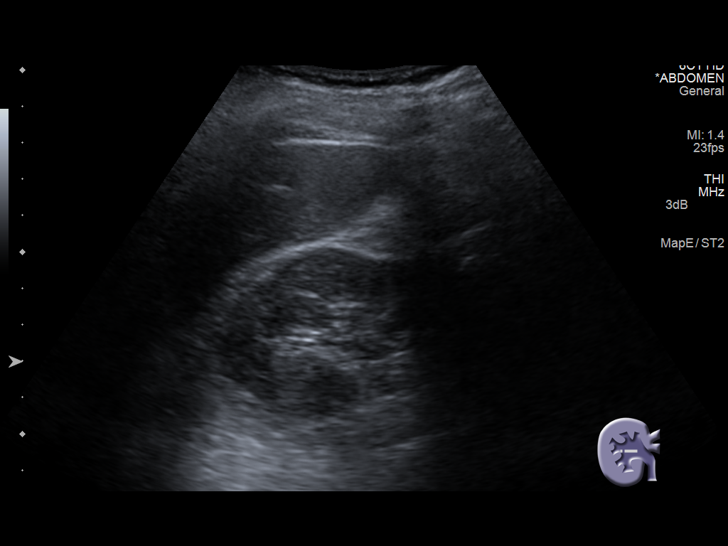
[im 110/132]
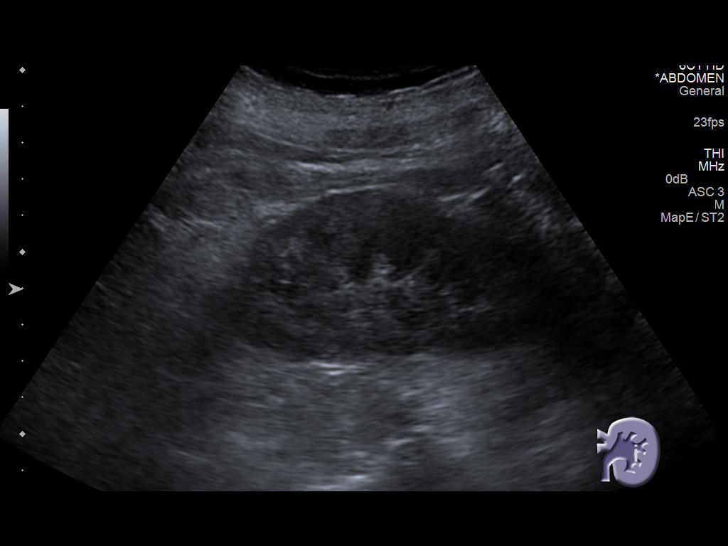
[im 121/132]
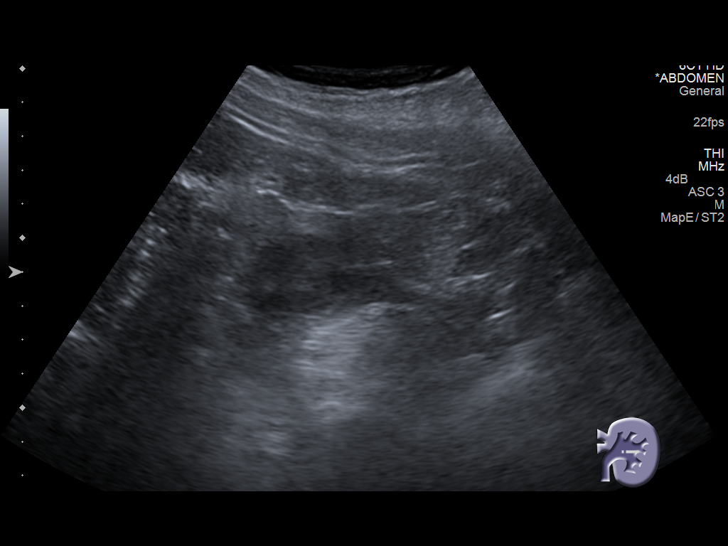
[im 132/132]
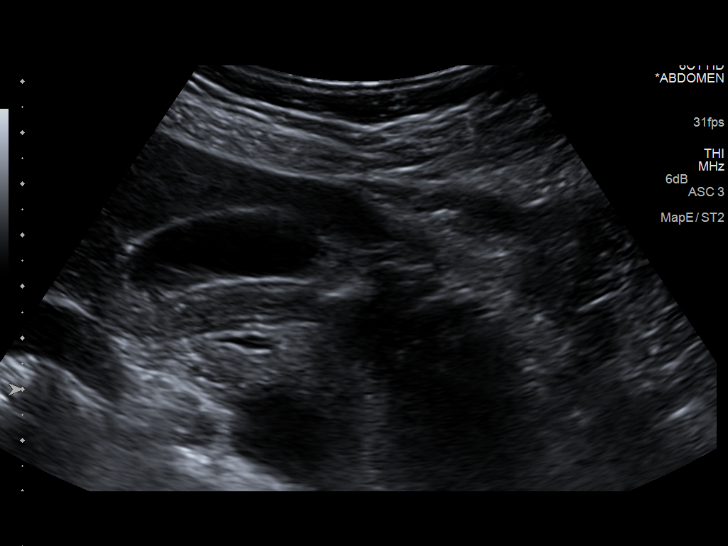

[14 of 25 positions shown; findings below may reference images not displayed]

FINDINGS: Gallbladder: No gallstones or wall thickening visualized. No
sonographic Murphy sign noted.

Common bile duct: Diameter: 11.1 mm

Liver: No focal lesion identified. Within normal limits in
parenchymal echogenicity.

IVC: No abnormality visualized.

Pancreas: Visualized portion unremarkable.

Spleen: Size and appearance within normal limits.

Right Kidney: Length: 12.0 cm. Echogenicity within normal limits. No
mass or hydronephrosis visualized.

Left Kidney: Length: 11.7 cm. Echogenicity within normal limits. No
mass or hydronephrosis visualized.

Abdominal aorta: No aneurysm visualized.

Other findings: None.
IMPRESSION: No gallstones. Common bile duct prominence to 11.1 mm. No
obstructing lesion in identified. Correlation with liver function
tests suggested.

## 2016-02-10 ENCOUNTER — Other Ambulatory Visit: Payer: Self-pay | Admitting: Internal Medicine

## 2016-02-12 ENCOUNTER — Other Ambulatory Visit: Payer: Self-pay | Admitting: Internal Medicine

## 2016-02-12 MED ORDER — PANTOPRAZOLE SODIUM 40 MG PO TBEC: 40.0000 mg | DELAYED_RELEASE_TABLET | Freq: Every day | ORAL | 0 refills | Status: DC

## 2016-02-29 ENCOUNTER — Ambulatory Visit: Payer: Medicare Other | Admitting: Internal Medicine

## 2016-03-10 ENCOUNTER — Other Ambulatory Visit: Payer: Self-pay | Admitting: Internal Medicine

## 2016-04-08 ENCOUNTER — Other Ambulatory Visit: Payer: Self-pay | Admitting: Internal Medicine

## 2016-04-17 ENCOUNTER — Ambulatory Visit (INDEPENDENT_AMBULATORY_CARE_PROVIDER_SITE_OTHER): Payer: Medicare Other | Admitting: Internal Medicine

## 2016-04-17 ENCOUNTER — Encounter: Payer: Self-pay | Admitting: Internal Medicine

## 2016-04-17 VITALS — Ht 70.0 in | Wt 172.0 lb

## 2016-04-17 DIAGNOSIS — E538 Deficiency of other specified B group vitamins: Secondary | ICD-10-CM

## 2016-04-17 DIAGNOSIS — E559 Vitamin D deficiency, unspecified: Secondary | ICD-10-CM | POA: Diagnosis not present

## 2016-04-17 DIAGNOSIS — K2990 Gastroduodenitis, unspecified, without bleeding: Secondary | ICD-10-CM | POA: Diagnosis not present

## 2016-04-17 DIAGNOSIS — K219 Gastro-esophageal reflux disease without esophagitis: Secondary | ICD-10-CM | POA: Diagnosis not present

## 2016-04-17 DIAGNOSIS — K5000 Crohn's disease of small intestine without complications: Secondary | ICD-10-CM

## 2016-04-17 MED ORDER — PANTOPRAZOLE SODIUM 40 MG PO TBEC: 40.0000 mg | DELAYED_RELEASE_TABLET | Freq: Every day | ORAL | 3 refills | Status: DC

## 2016-04-17 NOTE — Progress Notes (Signed)
Subjective:    Patient ID: Ralph Asch., male    DOB: 1940/11/23, 76 y.o.   MRN: [id number]  HPI Ralph Perkins is a 76 year old male with history of ileal Crohn's disease, peptic duodenitis, GERD with gastritis, B12 and vitamin D deficiency, anxiety, remote prostate cancer status post prostatectomy, hypertension and hyperlipidemia who is here for follow-up. He was last seen in April 2017. He is here today with his wife.  He reports that he is "so much better" than he was when he initially was seen here in November 2016. He reports he is 95 200% better. He is exercising on a daily basis 7 days a week walking about 5 miles a day with his wife. He denies trouble swallowing, nausea, vomiting, heartburn, abdominal pain. Bowel habits are regular with one formed brown stool daily. No blood in his stool or melena. No diarrhea or constipation.  He continues pantoprazole 40 mg daily. He is hesitant to decrease or stop this medication given the benefit it has provided. He is taking B12 and vitamin D daily. His blood work was performed by primary care in December and all blood counts were normal. B12 and vitamin D have normalized. CMP was normal with normal liver enzymes and kidney function.  He reports rarely will he wake in the middle of the night feeling anxious. I had previously prescribed clonazepam 0.5 mg. He will use this once a month or less with resolution of his anxiety. He still has the initial prescription I provided over one year ago. When he takes this he usually uses 0.25 mg. Anxiety and anxiousness have not been an issue for him during the daytime recently.  Review of Systems As per history of present illness, otherwise negative  Current Medications, Allergies, Past Medical History, Past Surgical History, Family History and Social History were reviewed in [employer] record.     Objective:   Physical Exam Ht 5' 10"  (1.778 m)   Wt 172 lb (78 kg)   BMI 24.68  kg/m  Constitutional: Well-developed and well-nourished. No distress. HEENT: Normocephalic and atraumatic. Oropharynx is clear and moist. No oropharyngeal exudate. Conjunctivae are normal.  No scleral icterus. Neck: Neck supple. Trachea midline. Cardiovascular: Normal rate, regular rhythm and intact distal pulses. No M/R/G Pulmonary/chest: Effort normal and breath sounds normal. No wheezing, rales or rhonchi. Abdominal: Soft, nontender, nondistended. Bowel sounds active throughout. There are no masses palpable. No hepatosplenomegaly. Extremities: no clubbing, cyanosis, or edema Lymphadenopathy: No cervical adenopathy noted. Neurological: Alert and oriented to person place and time. Skin: Skin is warm and dry. No rashes noted. Psychiatric: Normal mood and affect. Behavior is normal.  Labs reviewed per primary care and summarized in HPI     Assessment & Plan:   76 year old male with history of ileal Crohn's disease, peptic duodenitis, GERD with gastritis, B12 and vitamin D deficiency, anxiety, remote prostate cancer status post prostatectomy, hypertension and hyperlipidemia who is here for follow-up.  1. Gastroduodenitis/GERD -- continues to have complete resolution of symptoms and has done very well with pantoprazole. We discussed the risks, benefits and alternatives to PPI therapy including effect on bone health. We discussed reducing the dose to 20 mg daily given his well being and improvement with this medication he prefers to continue current dose. I'm happy with this plan as well. Continue pantoprazole 40 mg once daily.  2. Ileal Crohn's disease -- clinical remission with no evidence of Crohn's disease clinically. Blood counts are normal. There is no evidence  of anemia or iron deficiency. No abdominal pain. No diarrhea. Not currently on therapy and certainly his Crohn's is very mild. We will continue monitoring only for now without medical therapy.  3. B12 and vitamin D deficiencies --  repleted and continues daily oral supplementation. Followed by primary care.  4. Anxiety -- chronic issue for him but improving. Can continue to use clonazepam 0.25-0.5 mg on an as-needed basis. His prescription has expired but he still has tablets left. This can be refilled for him if needed. He is using this very rarely.  Return office visit in 12-18 months, sooner if necessary 25 minutes spent with the patient today. Greater than 50% was spent in counseling and coordination of care with the patient

## 2016-04-17 NOTE — Patient Instructions (Signed)
We have sent the following medications to your pharmacy for you to pick up at your convenience: Pantoprazole 40 mg daily  Please follow up with Dr Hilarie Fredrickson in 12-18 months.  If you are age 74 or older, your body mass index should be between 23-30. Your Body mass index is 24.68 kg/m. If this is out of the aforementioned range listed, please consider follow up with your Primary Care Provider.  If you are age 65 or younger, your body mass index should be between 19-25. Your Body mass index is 24.68 kg/m. If this is out of the aformentioned range listed, please consider follow up with your Primary Care Provider.

## 2016-10-10 ENCOUNTER — Encounter: Payer: Self-pay | Admitting: Internal Medicine

## 2017-04-16 ENCOUNTER — Other Ambulatory Visit: Payer: Self-pay | Admitting: Internal Medicine

## 2017-07-21 ENCOUNTER — Other Ambulatory Visit: Payer: Self-pay | Admitting: Internal Medicine

## 2019-07-30 DIAGNOSIS — Z0000 Encounter for general adult medical examination without abnormal findings: Secondary | ICD-10-CM | POA: Diagnosis not present

## 2019-07-30 DIAGNOSIS — Z1331 Encounter for screening for depression: Secondary | ICD-10-CM | POA: Diagnosis not present

## 2019-07-30 DIAGNOSIS — M810 Age-related osteoporosis without current pathological fracture: Secondary | ICD-10-CM | POA: Diagnosis not present

## 2019-07-30 DIAGNOSIS — Z6824 Body mass index (BMI) 24.0-24.9, adult: Secondary | ICD-10-CM | POA: Diagnosis not present

## 2019-07-30 DIAGNOSIS — E039 Hypothyroidism, unspecified: Secondary | ICD-10-CM | POA: Diagnosis not present

## 2019-07-30 DIAGNOSIS — Z9181 History of falling: Secondary | ICD-10-CM | POA: Diagnosis not present

## 2019-08-09 DIAGNOSIS — R011 Cardiac murmur, unspecified: Secondary | ICD-10-CM | POA: Diagnosis not present

## 2019-10-25 DIAGNOSIS — L570 Actinic keratosis: Secondary | ICD-10-CM | POA: Diagnosis not present

## 2019-10-25 DIAGNOSIS — D1801 Hemangioma of skin and subcutaneous tissue: Secondary | ICD-10-CM | POA: Diagnosis not present

## 2019-10-25 DIAGNOSIS — L821 Other seborrheic keratosis: Secondary | ICD-10-CM | POA: Diagnosis not present

## 2019-10-25 DIAGNOSIS — D485 Neoplasm of uncertain behavior of skin: Secondary | ICD-10-CM | POA: Diagnosis not present

## 2019-12-15 DIAGNOSIS — Z23 Encounter for immunization: Secondary | ICD-10-CM | POA: Diagnosis not present

## 2020-05-02 DIAGNOSIS — E039 Hypothyroidism, unspecified: Secondary | ICD-10-CM | POA: Diagnosis not present

## 2020-05-02 DIAGNOSIS — E782 Mixed hyperlipidemia: Secondary | ICD-10-CM | POA: Diagnosis not present

## 2020-05-02 DIAGNOSIS — Z79899 Other long term (current) drug therapy: Secondary | ICD-10-CM | POA: Diagnosis not present

## 2020-07-17 DIAGNOSIS — Z23 Encounter for immunization: Secondary | ICD-10-CM | POA: Diagnosis not present

## 2020-08-09 DIAGNOSIS — H33302 Unspecified retinal break, left eye: Secondary | ICD-10-CM | POA: Diagnosis not present

## 2020-08-09 DIAGNOSIS — H524 Presbyopia: Secondary | ICD-10-CM | POA: Diagnosis not present

## 2020-08-09 DIAGNOSIS — H2513 Age-related nuclear cataract, bilateral: Secondary | ICD-10-CM | POA: Diagnosis not present

## 2020-08-09 DIAGNOSIS — H5203 Hypermetropia, bilateral: Secondary | ICD-10-CM | POA: Diagnosis not present

## 2020-08-21 DIAGNOSIS — Z6824 Body mass index (BMI) 24.0-24.9, adult: Secondary | ICD-10-CM | POA: Diagnosis not present

## 2020-08-21 DIAGNOSIS — Z79899 Other long term (current) drug therapy: Secondary | ICD-10-CM | POA: Diagnosis not present

## 2020-08-21 DIAGNOSIS — Z1331 Encounter for screening for depression: Secondary | ICD-10-CM | POA: Diagnosis not present

## 2020-08-21 DIAGNOSIS — E039 Hypothyroidism, unspecified: Secondary | ICD-10-CM | POA: Diagnosis not present

## 2020-08-21 DIAGNOSIS — E782 Mixed hyperlipidemia: Secondary | ICD-10-CM | POA: Diagnosis not present

## 2020-08-21 DIAGNOSIS — Z125 Encounter for screening for malignant neoplasm of prostate: Secondary | ICD-10-CM | POA: Diagnosis not present

## 2020-08-21 DIAGNOSIS — Z0000 Encounter for general adult medical examination without abnormal findings: Secondary | ICD-10-CM | POA: Diagnosis not present

## 2020-10-25 DIAGNOSIS — D1801 Hemangioma of skin and subcutaneous tissue: Secondary | ICD-10-CM | POA: Diagnosis not present

## 2020-10-25 DIAGNOSIS — L812 Freckles: Secondary | ICD-10-CM | POA: Diagnosis not present

## 2020-10-25 DIAGNOSIS — L821 Other seborrheic keratosis: Secondary | ICD-10-CM | POA: Diagnosis not present

## 2020-12-05 DIAGNOSIS — Z23 Encounter for immunization: Secondary | ICD-10-CM | POA: Diagnosis not present

## 2021-01-01 DIAGNOSIS — Z23 Encounter for immunization: Secondary | ICD-10-CM | POA: Diagnosis not present

## 2021-07-05 ENCOUNTER — Telehealth: Payer: Self-pay | Admitting: Internal Medicine

## 2021-07-05 NOTE — Telephone Encounter (Signed)
Pts wife states pt has been having issues with gerd/nervous stomach. Requesting to be seen sooner than Dr. Vena Rua first available appt. Pt scheduled to see Tye Savoy NP 07/17/21 at 1:30pm. Wife aware of appt.

## 2021-07-05 NOTE — Telephone Encounter (Signed)
Patients wife called states her husband his having Jerrye Bushy issues has been good with it until now seeking advise. Also stated they are good friends of Dr. Hilarie Fredrickson.

## 2021-07-06 ENCOUNTER — Telehealth: Payer: Self-pay | Admitting: Internal Medicine

## 2021-07-06 ENCOUNTER — Other Ambulatory Visit: Payer: Self-pay

## 2021-07-06 MED ORDER — CLONAZEPAM 0.5 MG PO TABS: ORAL_TABLET | ORAL | 1 refills | Status: DC

## 2021-07-06 MED ORDER — PANTOPRAZOLE SODIUM 40 MG PO TBEC: 40.0000 mg | DELAYED_RELEASE_TABLET | Freq: Every day | ORAL | 3 refills | Status: DC

## 2021-07-06 NOTE — Telephone Encounter (Signed)
Yes, ok to refill pantoprazole 40 mg once daily Clonazepam 0.5 mg (1/2 to 1 tablet every 8 hrs PRN) JMP

## 2021-07-06 NOTE — Telephone Encounter (Signed)
Spoke with pts wife and she is aware, prescriptions sent to pharmacy.

## 2021-07-06 NOTE — Telephone Encounter (Signed)
Please see note below and advise  

## 2021-07-06 NOTE — Telephone Encounter (Signed)
Patient's wife, Ralph Perkins, called to report patient is having difficulties with his stomach issues, which makes him very upset and causes panic attacks.  He has an appointment with Nevin Bloodgood on 6/20, but his wife doesn't think he'll be able to wait that long.  She was asking if Dr. Hilarie Fredrickson would be willing to prescribe the pantoprazole and clonazepam he gave him before enough to last him until he comes in for his appointment.  His wife states Dr. Hilarie Fredrickson knows them very well and is aware of how he gets.  Please call patient's wife and advise.  Thank you.

## 2021-07-17 ENCOUNTER — Encounter: Payer: Self-pay | Admitting: Nurse Practitioner

## 2021-07-17 ENCOUNTER — Ambulatory Visit: Payer: Medicare PPO | Admitting: Nurse Practitioner

## 2021-07-17 VITALS — BP 140/70 | HR 98 | Ht 70.0 in | Wt 165.0 lb

## 2021-07-17 DIAGNOSIS — K219 Gastro-esophageal reflux disease without esophagitis: Secondary | ICD-10-CM | POA: Diagnosis not present

## 2021-07-17 DIAGNOSIS — F419 Anxiety disorder, unspecified: Secondary | ICD-10-CM | POA: Diagnosis not present

## 2021-07-17 NOTE — Patient Instructions (Signed)
Continue Protonix (Pantoprazole) at noon everyday.   Continue Clonazepam at bedtime as needed.   If you are age 79 or older, your body mass index should be between 23-30. Your Body mass index is 23.68 kg/m. If this is out of the aforementioned range listed, please consider follow up with your Primary Care Provider.  If you are age 68 or younger, your body mass index should be between 19-25. Your Body mass index is 23.68 kg/m. If this is out of the aformentioned range listed, please consider follow up with your Primary Care Provider.   ________________________________________________________  The [facility] providers would like to encourage you to use [facility] to communicate with providers for non-urgent requests or questions.  Due to long hold times on the telephone, sending your provider a message by [facility] may be a faster and more efficient way to get a response.  Please allow 48 business hours for a response.  Please remember that this is for non-urgent requests.  _______________________________________________________  Thank you for choosing me and [facility].  Tye Savoy NP
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
## 2021-07-23 ENCOUNTER — Telehealth: Payer: Self-pay

## 2021-08-15 DIAGNOSIS — H2513 Age-related nuclear cataract, bilateral: Secondary | ICD-10-CM | POA: Diagnosis not present

## 2021-08-15 DIAGNOSIS — H40013 Open angle with borderline findings, low risk, bilateral: Secondary | ICD-10-CM | POA: Diagnosis not present

## 2021-08-15 DIAGNOSIS — H5203 Hypermetropia, bilateral: Secondary | ICD-10-CM | POA: Diagnosis not present

## 2021-08-31 DIAGNOSIS — F410 Panic disorder [episodic paroxysmal anxiety] without agoraphobia: Secondary | ICD-10-CM | POA: Diagnosis not present

## 2021-08-31 DIAGNOSIS — K5909 Other constipation: Secondary | ICD-10-CM | POA: Diagnosis not present

## 2021-08-31 DIAGNOSIS — E7800 Pure hypercholesterolemia, unspecified: Secondary | ICD-10-CM | POA: Diagnosis not present

## 2021-08-31 DIAGNOSIS — R5383 Other fatigue: Secondary | ICD-10-CM | POA: Diagnosis not present

## 2021-08-31 DIAGNOSIS — Z6822 Body mass index (BMI) 22.0-22.9, adult: Secondary | ICD-10-CM | POA: Diagnosis not present

## 2021-08-31 DIAGNOSIS — E559 Vitamin D deficiency, unspecified: Secondary | ICD-10-CM | POA: Diagnosis not present

## 2021-08-31 DIAGNOSIS — Z79899 Other long term (current) drug therapy: Secondary | ICD-10-CM | POA: Diagnosis not present

## 2021-08-31 DIAGNOSIS — I10 Essential (primary) hypertension: Secondary | ICD-10-CM | POA: Diagnosis not present

## 2021-08-31 DIAGNOSIS — K219 Gastro-esophageal reflux disease without esophagitis: Secondary | ICD-10-CM | POA: Diagnosis not present

## 2021-09-04 DIAGNOSIS — E871 Hypo-osmolality and hyponatremia: Secondary | ICD-10-CM | POA: Diagnosis not present

## 2021-09-16 ENCOUNTER — Other Ambulatory Visit: Payer: Self-pay | Admitting: Internal Medicine

## 2021-09-17 ENCOUNTER — Ambulatory Visit (INDEPENDENT_AMBULATORY_CARE_PROVIDER_SITE_OTHER): Payer: Medicare PPO | Admitting: Psychology

## 2021-09-17 DIAGNOSIS — F411 Generalized anxiety disorder: Secondary | ICD-10-CM | POA: Diagnosis not present

## 2021-09-17 DIAGNOSIS — F320 Major depressive disorder, single episode, mild: Secondary | ICD-10-CM

## 2021-09-17 DIAGNOSIS — F321 Major depressive disorder, single episode, moderate: Secondary | ICD-10-CM

## 2021-09-17 NOTE — Progress Notes (Addendum)
[facility] Initial Adult Intake  Name: Ralph Perkins. Date: 09/17/2021 MRN: [id number] DOB: Jan 15, 1941 PCP: Myer Peer, MD  Time spent: 12:00 -12:55 PM  Guardian/Payee:  Self    Paperwork requested: Yes   Today I met with  Ralph Perkins. in remote video (WebEx) face-to-face individual psychotherapy.   Distance Site: Client's Home Orginating Site: Dr Ralph Perkins Remote Office Consent: Obtained verbal consent to transmit  session remotely   Reason for Visit /Presenting Problem:  Ralph Perkins is 81 year old MWM and is approaching his 82th birthday.  He states that COVID was very hard on them.  He is still having a hard time being around people and being in big groups.  He has become a home body.  Obsessed with getting a good night sleep and other medical preoccupations.  Right now its constipation.  His wife thinks it is more related to the new medications he is taking.    He has been taking Esitalopram for 17 days.  He is also talking 3 Xanax throughout the night to help him sleep.    Fifty years ago he had an episode with anxiety and GI issues during a time of high stress.  Then   His younger brother got cancer and is undergoing chemotherapy.  The eyesight in his left eye has gotten worse because of a cataract but he was worried that he was losing his eyesight.  Ralph Perkins's son was very sick and for 48 hrs they thought he had Leukemia.  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  But it was at this point that they knew he needed help with his anxiety    Mental Status Exam: Appearance:   Casual and Fairly Groomed     Behavior:  Appropriate  Motor:  Normal  Speech/Language:   NA  Affect:  Appropriate  Mood:  anxious  Thought process:  normal  Thought content:    WNL  Sensory/Perceptual disturbances:    WNL  Orientation:  oriented to person, place, time/date, and situation  Attention:  NA  Concentration:  Good  Memory:  WNL  Fund of knowledge:   Good  Insight:     Good  Judgment:   Good  Impulse Control:  Good   Reported Symptoms:  PHQ 9 = 18 (Moderately Severe Depression) Pt endorsed little interest or pleasure in doing things, feeling down, disrupted sleep (discontinuous), fatigue, poor appetite and trouble concentrating nearly everyday.  GAD 7 = 15 (Severe Anxiety) Pt endorsed feeling nervous, worrying about different things, not being able to stop worrying, trouble relaxing and afraid something terrible will happen.   Risk Assessment: Danger to Self:  No Self-injurious Behavior: No Danger to Others: No Duty to Warn:no Physical Aggression / Violence:No  Substance Abuse History: Current substance abuse: No     Past Psychiatric History:   Previous psychological history is significant for anxiety Outpatient Providers: No previous experience History of Psych Hospitalization: No  Psychological Testing:  n/a    Abuse History:  Victim of: No.,       Report needed: No. Victim of Neglect:No. Witness / Exposure to Domestic Violence: No   Protective Services Involvement: No  Witness to [employer] Violence:  No   Family History:  Family History  Problem Relation Age of Onset   Diabetes Father    Prostate cancer Brother     Living situation: the patient lives with their spouse  Sexual Orientation: Straight  Relationship Status: married  Name of  spouse / other:  [name] (75) taught for 77 years, will be married for 50 years   If a parent, number of children / ages: Daughter: Ralph Perkins, Husband Mikki Perkins - she teachers at [employer] she is the [employer] coordinator for [place], 2 grandchildren  Son: Ralph Perkins he has a son the Perkins, [employer], lives in [place], 2 grandchildren  Support Systems: spouse family  Financial Stress:  No   Income/Employment/Disability: Public affairs consultant, 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    Stressors: Health problems    Strengths: Supportive Relationships, Family, Friends, and Church   Barriers:  their church is changing - [employer] split, congregation has changed and in a sense they have "lost their church"   Legal History: Pending legal issue / charges: The patient has no significant history of legal issues. History of legal issue / charges:  n/a  Medical History/Surgical History: reviewed Past Medical History:  Diagnosis Date   Adenocarcinoma of prostate, stage 1 ([facility])    Anxiety    Crohn's ileocolitis ([facility]) 2002   Dr Melina Copa   Diverticulum of appendix    periampullary   Duodenitis    peptic   Gastropathy    reactive   Herpes simplex    Hyperlipidemia    Hypertension     Past Surgical History:  Procedure Laterality Date   PROSTATECTOMY     TONSILLECTOMY      Medications: Current Outpatient Medications  Medication Sig Dispense Refill   clonazePAM (KLONOPIN) 0.5 MG tablet Take 1/2-1 tablet every 8 hours prn (Patient not taking: Reported on 07/17/2021) 30 tablet 1   B Complex Vitamins (B COMPLEX PO) Take 2,000 Units by mouth daily.     Cholecalciferol (VITAMIN D3) 5000 units CAPS Take 1 capsule by mouth daily. (Patient not taking: Reported on 07/17/2021)     levothyroxine (SYNTHROID, LEVOTHROID) 88 MCG tablet Take 88 mcg by mouth daily before breakfast.     lisinopril (PRINIVIL,ZESTRIL) 20 MG tablet Take 20 mg by mouth daily.     pantoprazole (PROTONIX) 40 MG tablet TAKE 1 TABLET BY MOUTH EVERY DAY 90 tablet 0   SYNTHROID 75 MCG tablet Take 75 mcg by mouth daily.     valACYclovir (VALTREX)  1000 MG tablet Take 500 mg by mouth 2 (two) times daily.     No current facility-administered medications for this visit.    Allergies  Allergen Reactions   Penicillins     As a chid    Diagnoses:  Generalized Anxiety Disorder Major Depressive Episode, Single Episode, Moderate   Plan of Care: Kainoa Swoboda is an 81 year old MWM who presents with high anxiety and depression.  It is highly likely depression is secondary to the anxiety.  Patient would benefit from a weekly individual psychotherapy that focused on providing psycho education about anxiety and depression, teach coping strategies and skills to better manage mood states.  Patient also seems to be hyper focused on health and sleep disturbance which will require further examination.  It is recommended that patient undergo a more  thorough medication evaluation to consider a longer term solution to his anxiety and depression and rely less on Klonopin as a rescue medication.    [name], PhD

## 2021-09-18 DIAGNOSIS — F419 Anxiety disorder, unspecified: Secondary | ICD-10-CM | POA: Diagnosis not present

## 2021-09-18 DIAGNOSIS — R630 Anorexia: Secondary | ICD-10-CM | POA: Diagnosis not present

## 2021-09-18 DIAGNOSIS — K5900 Constipation, unspecified: Secondary | ICD-10-CM | POA: Diagnosis not present

## 2021-09-18 DIAGNOSIS — Z6821 Body mass index (BMI) 21.0-21.9, adult: Secondary | ICD-10-CM | POA: Diagnosis not present

## 2021-09-18 DIAGNOSIS — E039 Hypothyroidism, unspecified: Secondary | ICD-10-CM | POA: Diagnosis not present

## 2021-09-18 DIAGNOSIS — B009 Herpesviral infection, unspecified: Secondary | ICD-10-CM | POA: Diagnosis not present

## 2021-09-18 DIAGNOSIS — F410 Panic disorder [episodic paroxysmal anxiety] without agoraphobia: Secondary | ICD-10-CM | POA: Diagnosis not present

## 2021-09-18 DIAGNOSIS — E871 Hypo-osmolality and hyponatremia: Secondary | ICD-10-CM | POA: Diagnosis not present

## 2021-09-18 DIAGNOSIS — I10 Essential (primary) hypertension: Secondary | ICD-10-CM | POA: Diagnosis not present

## 2021-09-18 DIAGNOSIS — F32A Depression, unspecified: Secondary | ICD-10-CM | POA: Diagnosis not present

## 2021-09-19 DIAGNOSIS — I10 Essential (primary) hypertension: Secondary | ICD-10-CM | POA: Diagnosis not present

## 2021-09-19 DIAGNOSIS — E871 Hypo-osmolality and hyponatremia: Secondary | ICD-10-CM | POA: Diagnosis not present

## 2021-09-19 DIAGNOSIS — R638 Other symptoms and signs concerning food and fluid intake: Secondary | ICD-10-CM | POA: Diagnosis not present

## 2021-09-19 DIAGNOSIS — K5900 Constipation, unspecified: Secondary | ICD-10-CM | POA: Diagnosis not present

## 2021-09-19 DIAGNOSIS — F419 Anxiety disorder, unspecified: Secondary | ICD-10-CM | POA: Diagnosis not present

## 2021-09-19 DIAGNOSIS — F32A Depression, unspecified: Secondary | ICD-10-CM | POA: Diagnosis not present

## 2021-09-19 DIAGNOSIS — E039 Hypothyroidism, unspecified: Secondary | ICD-10-CM | POA: Diagnosis not present

## 2021-09-27 ENCOUNTER — Ambulatory Visit (INDEPENDENT_AMBULATORY_CARE_PROVIDER_SITE_OTHER): Payer: Medicare PPO | Admitting: Psychology

## 2021-09-27 DIAGNOSIS — E871 Hypo-osmolality and hyponatremia: Secondary | ICD-10-CM | POA: Diagnosis not present

## 2021-09-27 DIAGNOSIS — F411 Generalized anxiety disorder: Secondary | ICD-10-CM

## 2021-09-27 NOTE — Progress Notes (Signed)
  PROGRESS NOTE  Name: Ralph Perkins. Date: 09/27/2021 MRN: [id number] DOB: 09/14/1940 PCP: Myer Peer, MD  Time spent:  Start: 10:00 AM End:  10:55 AM    Today I met with  Verl Blalock. in remote video (WebEx) face-to-face individual psychotherapy as an accomodation to the COVID-19 Pandemic.  Distance Site: Client's Home Orginating Site: Dr Jannifer Franklin Remote Office Consent: Obtained verbal consent to transmit  session remotely   Individualized Treatment Plan       Strengths: he's a leader, competitive, likes to win, people like him, he's nice, he supports his community, is a good dad and a grandfather  Supports: wife, adult children, friends   Goal/Needs for Treatment:  In order of importance to patient 1) Learn and implement skills and strategies to manage and decrease anxiety 2) Learn to better manage his obsessive thoughts about health concern  3)    Client Statement of Needs: he wants to be able to enjoy his life again, to have better control over his worrying and building his confidence   Treatment Level: Weekly Outpatient Individual Psychotherapy  Symptoms: many fears - that he will embarrass himself, appear weak, of dying or his wife's dying,   Client Treatment Preferences: prefers virtual appointments   Healthcare consumer's goal for treatment:  Psychologist, [name], [name] will support the patient's ability to achieve the goals identified. Cognitive Behavioral Therapy, Dialectical Behavioral Therapy, Motivational Interviewing, SPACE, parent training, and other evidenced-based practices will be used to promote progress towards healthy functioning.   Healthcare consumer Randon Goldsmith, Brooke Bonito. will: Actively participate in therapy, working towards healthy functioning.    *Justification for Continuation/Discontinuation of Goal: R=Revised, O=Ongoing, A=Achieved, D=Discontinued  Goal 1)   5 Point Likert rating baseline date: Target Date Goal Was  reviewed Status Code Progress towards goal/Likert rating  09/28/2022           O              Goal 2)  5 Point Likert rating baseline date: Target Date Goal Was reviewed Status Code Progress towards goal/Likert rating  09/28/2022           O              Goal 3)   5 Point Likert rating baseline date: Target Date Goal Was reviewed Status Code Progress towards goal/Likert rating  09/28/2022            O              This plan has been reviewed and created by the following participants:  This plan will be reviewed at least every 12 months. Date Behavioral Health Clinician Date Guardian/Patient   09/27/2021 [name], [name]  09/27/2021 Randon Goldsmith, Brooke Bonito.                    In session today, we d/ treatment goals and created his treatment plan.  Geramy participated actively and freely in the creation of his treatment plan.     [name], PhD

## 2021-10-03 DIAGNOSIS — E871 Hypo-osmolality and hyponatremia: Secondary | ICD-10-CM | POA: Diagnosis not present

## 2021-10-04 ENCOUNTER — Ambulatory Visit (INDEPENDENT_AMBULATORY_CARE_PROVIDER_SITE_OTHER): Payer: Medicare PPO | Admitting: Psychology

## 2021-10-04 DIAGNOSIS — F411 Generalized anxiety disorder: Secondary | ICD-10-CM | POA: Diagnosis not present

## 2021-10-04 NOTE — Progress Notes (Signed)
PROGRESS NOTE:  Name: Ralph Perkins. Date: 10/04/2021 MRN: [id number] DOB: Dec 15, 1940 PCP: Myer Peer, MD  Time spent:  Start: 10:00 AM End:  10:55 AM   Today I met with  Ralph Perkins. in remote video (WebEx) face-to-face individual psychotherapy as an accomodation to the COVID-19 Pandemic.  Distance Site: Client's Home Orginating Site: Dr Jannifer Franklin Remote Office Consent: Obtained verbal consent to transmit  session remotely   Individualized Treatment Plan       Strengths: he's a leader, competitive, likes to win, people like him, he's nice, he supports his community, is a good dad and a grandfather  Supports: wife, adult children, friends   Goal/Needs for Treatment:  In order of importance to patient 1) Learn and implement skills and strategies to manage and decrease anxiety 2) Learn to better manage his obsessive thoughts about health concern     Client Statement of Needs: he wants to be able to enjoy his life again, to have better control over his worrying and building his confidence   Treatment Level: Weekly Outpatient Individual Psychotherapy  Symptoms: many fears - that he will embarrass himself, appear weak, of dying or his wife's dying,   Client Treatment Preferences: prefers virtual appointments   Healthcare consumer's goal for treatment:  Psychologist, [name], [name] will support the patient's ability to achieve the goals identified. Cognitive Behavioral Therapy, Dialectical Behavioral Therapy, Motivational Interviewing, SPACE, parent training, and other evidenced-based practices will be used to promote progress towards healthy functioning.   Healthcare consumer Ralph Perkins, Ralph Perkins. will: Actively participate in therapy, working towards healthy functioning.    *Justification for Continuation/Discontinuation of Goal: R=Revised, O=Ongoing, A=Achieved, D=Discontinued  Goal 1)Learn and implement skills and strategies to manage and decrease anxiety    5 Point Likert rating baseline date: Target Date Goal Was reviewed Status Code Progress towards goal/Likert rating  09/28/2022           O              Goal 2) Learn to better manage his obsessive thoughts about health concern  5 Point Likert rating baseline date: Target Date Goal Was reviewed Status Code Progress towards goal/Likert rating  09/28/2022           O                This plan has been reviewed and created by the following participants:  This plan will be reviewed at least every 12 months. Date Behavioral Health Clinician Date Guardian/Patient   09/27/2021 [name], [name]  09/27/2021 Ralph Perkins, Ralph Perkins.                    Merrick reports that his blood work was doing better.  Yet he got anxious that he might be having a heart attack yesterday.  Ralph Perkins took his blood pressure to reassure him but it still took a while to calm down.  In session, I provided psycho education about how anxiety works, its mechanisms and physiological responses.  I did a teach on the anxious thought process and how it is possible to interrupt the circuit.   We then worked with some of his anxious thoughts - how to challenge them and how to reinforce coping.  Jesson's wife Ralph Perkins was present and we d/ how she might help to coach him in the beginning but that it was important for [place] to eventually to learn to do this for himself.  [name], PhD   ______________________________________________________

## 2021-10-08 DIAGNOSIS — I700 Atherosclerosis of aorta: Secondary | ICD-10-CM | POA: Diagnosis not present

## 2021-10-08 DIAGNOSIS — Z043 Encounter for examination and observation following other accident: Secondary | ICD-10-CM | POA: Diagnosis not present

## 2021-10-08 DIAGNOSIS — R251 Tremor, unspecified: Secondary | ICD-10-CM | POA: Diagnosis not present

## 2021-10-08 DIAGNOSIS — S335XXA Sprain of ligaments of lumbar spine, initial encounter: Secondary | ICD-10-CM | POA: Diagnosis not present

## 2021-10-08 DIAGNOSIS — S300XXA Contusion of lower back and pelvis, initial encounter: Secondary | ICD-10-CM | POA: Diagnosis not present

## 2021-10-08 DIAGNOSIS — R109 Unspecified abdominal pain: Secondary | ICD-10-CM | POA: Diagnosis not present

## 2021-10-08 DIAGNOSIS — T148XXA Other injury of unspecified body region, initial encounter: Secondary | ICD-10-CM | POA: Diagnosis not present

## 2021-10-08 DIAGNOSIS — I4510 Unspecified right bundle-branch block: Secondary | ICD-10-CM | POA: Diagnosis not present

## 2021-10-08 DIAGNOSIS — S0990XA Unspecified injury of head, initial encounter: Secondary | ICD-10-CM | POA: Diagnosis not present

## 2021-10-08 DIAGNOSIS — W108XXA Fall (on) (from) other stairs and steps, initial encounter: Secondary | ICD-10-CM | POA: Diagnosis not present

## 2021-10-08 DIAGNOSIS — Z79899 Other long term (current) drug therapy: Secondary | ICD-10-CM | POA: Diagnosis not present

## 2021-10-08 DIAGNOSIS — E871 Hypo-osmolality and hyponatremia: Secondary | ICD-10-CM | POA: Diagnosis not present

## 2021-10-11 ENCOUNTER — Telehealth: Payer: Self-pay

## 2021-10-11 ENCOUNTER — Other Ambulatory Visit: Payer: Self-pay

## 2021-10-11 ENCOUNTER — Ambulatory Visit: Payer: Medicare PPO | Admitting: Psychology

## 2021-10-11 MED ORDER — ONDANSETRON HCL 4 MG PO TABS
4.0000 mg | ORAL_TABLET | Freq: Three times a day (TID) | ORAL | 1 refills | Status: DC | PRN
Start: 2021-10-11 — End: 2022-01-16

## 2021-10-11 NOTE — Telephone Encounter (Signed)
Can try ondansetron ODT 4 mg; every 8-12 hours as needed for nausea

## 2021-10-11 NOTE — Progress Notes (Signed)
Prescription sent to pharmacy.

## 2021-10-11 NOTE — Telephone Encounter (Signed)
Prescription sent to pharmacy and pt notified via mychart.

## 2021-10-11 NOTE — Telephone Encounter (Signed)
Pt scheduled to see Dr. Hilarie Fredrickson 10/24/21 at 11:30am per Dr. Vena Rua request. Pt aware of appt. Wife states he is having some nausea and wanted to know what Dr. Hilarie Fredrickson recommends prior to OV.

## 2021-10-12 DIAGNOSIS — Z6822 Body mass index (BMI) 22.0-22.9, adult: Secondary | ICD-10-CM | POA: Diagnosis not present

## 2021-10-12 DIAGNOSIS — I10 Essential (primary) hypertension: Secondary | ICD-10-CM | POA: Diagnosis not present

## 2021-10-12 DIAGNOSIS — K921 Melena: Secondary | ICD-10-CM | POA: Diagnosis not present

## 2021-10-12 DIAGNOSIS — R143 Flatulence: Secondary | ICD-10-CM | POA: Diagnosis not present

## 2021-10-12 DIAGNOSIS — R269 Unspecified abnormalities of gait and mobility: Secondary | ICD-10-CM | POA: Diagnosis not present

## 2021-10-12 DIAGNOSIS — F419 Anxiety disorder, unspecified: Secondary | ICD-10-CM | POA: Diagnosis not present

## 2021-10-12 DIAGNOSIS — R451 Restlessness and agitation: Secondary | ICD-10-CM | POA: Diagnosis not present

## 2021-10-12 DIAGNOSIS — K648 Other hemorrhoids: Secondary | ICD-10-CM | POA: Diagnosis not present

## 2021-10-16 DIAGNOSIS — K921 Melena: Secondary | ICD-10-CM | POA: Diagnosis not present

## 2021-10-17 ENCOUNTER — Telehealth: Payer: Self-pay | Admitting: Internal Medicine

## 2021-10-17 NOTE — Telephone Encounter (Signed)
Pts wife calling states pt is not doing well at all, states he is the worst he has been. He spent 2 days at [facility] with anxiety meds being changed, he has been dizzy and fell down their stairs. Nothing was broken but had ER visit. Reports the nausea/gas and pressure on his bowels has been really bad. Pcp did rectal exam and found blood, surgeon in [place] did anoscope and told them it is not hemorrhoids and that he needed to contact our office and probably need colonoscopy. He did take zofran this am and thinks it may be helping a little. Only on Duloxetine at night for anxiety. She does not know what to do for him at this point. Please advise.

## 2021-10-17 NOTE — Telephone Encounter (Signed)
Pt scheduled to see Dr. Hilarie Fredrickson 10/22/21 at 1:30pm. Left message for pts wife regarding the appt being moved. Mychart message sent to pt also.

## 2021-10-17 NOTE — Telephone Encounter (Signed)
Please move the patient's office visit to Monday afternoon at 130. This is my administrative time but you can create an office spot at 130 Thanks JMP

## 2021-10-17 NOTE — Telephone Encounter (Signed)
Inbound call from patients wife stating her husband isn't getting any better, the nausea is getting worse and he has blood in his stool, she is worried and requesting a call back to further advise. Thank you

## 2021-10-18 ENCOUNTER — Ambulatory Visit (INDEPENDENT_AMBULATORY_CARE_PROVIDER_SITE_OTHER): Payer: Medicare PPO | Admitting: Psychology

## 2021-10-18 ENCOUNTER — Other Ambulatory Visit: Payer: Self-pay | Admitting: Nurse Practitioner

## 2021-10-18 DIAGNOSIS — F411 Generalized anxiety disorder: Secondary | ICD-10-CM | POA: Diagnosis not present

## 2021-10-18 NOTE — Progress Notes (Signed)
  PROGRESS NOTE:  Name: Ralph Perkins. Date: 10/18/2021 MRN: [id number] DOB: [birth year] PCP: Myer Peer, MD  Time spent:  Start: 10:00 AM End:  10:55 AM   Today I met with  Ralph Perkins. in remote video (WebEx) face-to-face individual psychotherapy.  Distance Site: Client's Home Orginating Site: Dr Jannifer Franklin Remote Office Consent: Obtained verbal consent to transmit session remotely   Individualized Treatment Plan             Strengths: he's a leader, competitive, likes to win, people like him, he's nice, he supports his community, is a good dad and a grandfather  Supports: wife, adult children, friends   Goal/Needs for Treatment:  In order of importance to patient 1) Learn and implement skills and strategies to manage and decrease anxiety 2) Learn to better manage his obsessive thoughts about health concern     Client Statement of Needs: he wants to be able to enjoy his life again, to have better control over his worrying and building his confidence   Treatment Level: Weekly Outpatient Individual Psychotherapy  Symptoms: many fears - that he will embarrass himself, appear weak, of dying or his wife's dying,   Client Treatment Preferences: prefers virtual appointments   Healthcare consumer's goal for treatment:  Psychologist, Ralph Perkins, [name] will support the patient's ability to achieve the goals identified. Cognitive Behavioral Therapy, Dialectical Behavioral Therapy, Motivational Interviewing, SPACE, parent training, and other evidenced-based practices will be used to promote progress towards healthy functioning.   Healthcare consumer Ralph Perkins, Ralph Perkins. will: Actively participate in therapy, working towards healthy functioning.    *Justification for Continuation/Discontinuation of Goal: R=Revised, O=Ongoing, A=Achieved, D=Discontinued  Goal 1)Learn and implement skills and strategies to manage and decrease anxiety   5 Point Likert rating baseline  date: Target Date Goal Was reviewed Status Code Progress towards goal/Likert rating  09/28/2022           O              Goal 2) Learn to better manage his obsessive thoughts about health concern  5 Point Likert rating baseline date: Target Date Goal Was reviewed Status Code Progress towards goal/Likert rating  09/28/2022           O                This plan has been reviewed and created by the following participants:  This plan will be reviewed at least every 12 months. Date Behavioral Health Clinician Date Guardian/Patient   09/27/2021 Ralph Perkins, [name]  09/27/2021 Ralph Perkins, Ralph Perkins.                    Ralph Perkins reports that     Ralph Crochet, PhD   ______________________________________________________

## 2021-10-19 ENCOUNTER — Telehealth: Payer: Self-pay | Admitting: Internal Medicine

## 2021-10-19 NOTE — Telephone Encounter (Signed)
Discussed with pts wife that he can take 1-3 doses of miralax/day. Let her know that if he does not have a BM they can do a miralax purge. She verbalized understanding and thanked me for the call.

## 2021-10-19 NOTE — Telephone Encounter (Signed)
Patient wife called states that patient was not able to have bowel movement since Tuesday 9/19. Requesting a call back as soon as possible. Please call to advise.

## 2021-10-20 DIAGNOSIS — Z79899 Other long term (current) drug therapy: Secondary | ICD-10-CM | POA: Diagnosis not present

## 2021-10-20 DIAGNOSIS — I10 Essential (primary) hypertension: Secondary | ICD-10-CM | POA: Diagnosis not present

## 2021-10-20 DIAGNOSIS — I700 Atherosclerosis of aorta: Secondary | ICD-10-CM | POA: Diagnosis not present

## 2021-10-20 DIAGNOSIS — F419 Anxiety disorder, unspecified: Secondary | ICD-10-CM | POA: Diagnosis not present

## 2021-10-20 DIAGNOSIS — E871 Hypo-osmolality and hyponatremia: Secondary | ICD-10-CM | POA: Diagnosis not present

## 2021-10-20 DIAGNOSIS — R109 Unspecified abdominal pain: Secondary | ICD-10-CM | POA: Diagnosis not present

## 2021-10-20 DIAGNOSIS — E860 Dehydration: Secondary | ICD-10-CM | POA: Diagnosis not present

## 2021-10-20 DIAGNOSIS — K219 Gastro-esophageal reflux disease without esophagitis: Secondary | ICD-10-CM | POA: Diagnosis not present

## 2021-10-20 DIAGNOSIS — K5900 Constipation, unspecified: Secondary | ICD-10-CM | POA: Diagnosis not present

## 2021-10-20 DIAGNOSIS — S301XXA Contusion of abdominal wall, initial encounter: Secondary | ICD-10-CM | POA: Diagnosis not present

## 2021-10-20 DIAGNOSIS — Z880 Allergy status to penicillin: Secondary | ICD-10-CM | POA: Diagnosis not present

## 2021-10-21 DIAGNOSIS — S301XXA Contusion of abdominal wall, initial encounter: Secondary | ICD-10-CM | POA: Diagnosis not present

## 2021-10-21 DIAGNOSIS — F419 Anxiety disorder, unspecified: Secondary | ICD-10-CM | POA: Diagnosis not present

## 2021-10-21 DIAGNOSIS — E871 Hypo-osmolality and hyponatremia: Secondary | ICD-10-CM | POA: Diagnosis not present

## 2021-10-21 DIAGNOSIS — I10 Essential (primary) hypertension: Secondary | ICD-10-CM | POA: Diagnosis not present

## 2021-10-21 DIAGNOSIS — K5900 Constipation, unspecified: Secondary | ICD-10-CM | POA: Diagnosis not present

## 2021-10-21 DIAGNOSIS — K219 Gastro-esophageal reflux disease without esophagitis: Secondary | ICD-10-CM | POA: Diagnosis not present

## 2021-10-21 DIAGNOSIS — E860 Dehydration: Secondary | ICD-10-CM | POA: Diagnosis not present

## 2021-10-21 DIAGNOSIS — Z880 Allergy status to penicillin: Secondary | ICD-10-CM | POA: Diagnosis not present

## 2021-10-21 DIAGNOSIS — Z79899 Other long term (current) drug therapy: Secondary | ICD-10-CM | POA: Diagnosis not present

## 2021-10-22 ENCOUNTER — Other Ambulatory Visit (INDEPENDENT_AMBULATORY_CARE_PROVIDER_SITE_OTHER): Payer: Medicare PPO

## 2021-10-22 ENCOUNTER — Ambulatory Visit (INDEPENDENT_AMBULATORY_CARE_PROVIDER_SITE_OTHER): Payer: Medicare PPO | Admitting: Internal Medicine

## 2021-10-22 ENCOUNTER — Encounter: Payer: Self-pay | Admitting: Internal Medicine

## 2021-10-22 ENCOUNTER — Other Ambulatory Visit: Payer: Self-pay | Admitting: Internal Medicine

## 2021-10-22 VITALS — BP 110/70 | HR 112 | Ht 70.0 in | Wt 152.0 lb

## 2021-10-22 DIAGNOSIS — K5080 Crohn's disease of both small and large intestine without complications: Secondary | ICD-10-CM | POA: Diagnosis not present

## 2021-10-22 DIAGNOSIS — K5900 Constipation, unspecified: Secondary | ICD-10-CM

## 2021-10-22 DIAGNOSIS — F411 Generalized anxiety disorder: Secondary | ICD-10-CM | POA: Diagnosis not present

## 2021-10-22 DIAGNOSIS — R634 Abnormal weight loss: Secondary | ICD-10-CM

## 2021-10-22 DIAGNOSIS — E538 Deficiency of other specified B group vitamins: Secondary | ICD-10-CM | POA: Diagnosis not present

## 2021-10-22 DIAGNOSIS — E871 Hypo-osmolality and hyponatremia: Secondary | ICD-10-CM | POA: Diagnosis not present

## 2021-10-22 DIAGNOSIS — R935 Abnormal findings on diagnostic imaging of other abdominal regions, including retroperitoneum: Secondary | ICD-10-CM

## 2021-10-22 DIAGNOSIS — R110 Nausea: Secondary | ICD-10-CM

## 2021-10-22 LAB — CORTISOL: Cortisol, Plasma: 10.1 ug/dL

## 2021-10-22 LAB — BASIC METABOLIC PANEL
BUN: 12 mg/dL (ref 6–23)
CO2: 26 mEq/L (ref 19–32)
Calcium: 9.2 mg/dL (ref 8.4–10.5)
Chloride: 88 mEq/L — ABNORMAL LOW (ref 96–112)
Creatinine, Ser: 0.92 mg/dL (ref 0.40–1.50)
GFR: 79.19 mL/min (ref >60.00)
Glucose, Bld: 112 mg/dL — ABNORMAL HIGH (ref 70–99)
Potassium: 4 mEq/L (ref 3.5–5.1)
Sodium: 123 mEq/L — ABNORMAL LOW (ref 135–145)

## 2021-10-22 LAB — TSH: TSH: 8.07 u[IU]/mL — ABNORMAL HIGH (ref 0.35–5.50)

## 2021-10-22 LAB — VITAMIN B12: Vitamin B-12: 846 pg/mL (ref 211–911)

## 2021-10-22 MED ORDER — BUDESONIDE 3 MG PO CPEP: 9.0000 mg | ORAL_CAPSULE | Freq: Every day | ORAL | 0 refills | Status: DC

## 2021-10-22 MED ORDER — PLENVU 140 G PO SOLR: 1.0000 | ORAL | 0 refills | Status: DC

## 2021-10-22 NOTE — Progress Notes (Signed)
Subjective:    Patient ID: Ralph Perkins., male    DOB: 1940-02-01, 81 y.o.   MRN: [id number]  HPI Ralph Perkins is a 81 year old male with a history of ileal and colonic Crohn's disease which has been predominantly subclinical and was first diagnosed endoscopically in 2002, GERD with gastritis history, B12 and vitamin D deficiency, remote prostate cancer status post prostatectomy, hypertension, hyperlipidemia and recent hyponatremia who is here for follow-up.  He is here today with his wife.  He has been struggling over the last 4 months or so and has been hospitalized 3 times in that period of time.  He was admitted overnight from Saturday to Sunday and was discharged yesterday for hyponatremia.  He also had a fecal impaction relieved with enema.  It sounds like he received hypertonic saline and sodium improved from 1 20-1 25 and he was discharged on oral sodium chloride 1000 mg 3 times daily.  He ports that he has had an extremely poor appetite with intermittent nausea.  No vomiting.  10 to 15 pound weight loss in the last 4 months.  He has found it more more difficult to have bowel movements over this time.  He has had to strain with harder and smaller stools.  Prior to 4 months ago he reports his bowels were "like clockwork".  He has seen some red blood on 1 or 2 occasions but no hematochezia or melena.  Primary care found stools to be heme positive.  His wife has noticed some mild orthostatic changes in blood pressure at home with borderline hypertension with sitting and lower blood pressure in the 100-1 15 range after standing.  He has not really had abdominal pain.  Several weeks ago he fell while going downstairs and had bruising on his arms and lower back.  Reportedly pan CT scan was negative for acute fracture or bleeding.  He most recently had a repeat CT scan which showed ileitis and he was started on levofloxacin and metronidazole which she is only taken for the last 48 hours.  He  has seen Dr. Walker Shadow with behavioral medicine and at her advice along with Dr. Lin Landsman, his PCP he has started on Cymbalta within the last 2 to 3 weeks.  He briefly took mirtazapine but not long enough to see efficacy.  He uses very sporadic and very rare clonazepam which does help significantly with anxiety.  He did take a clonazepam before coming here today.  He also has a daughter who lives in [place] who said Crohn's disease requiring surgery.    Review of Systems As per HPI, otherwise negative  Current Medications, Allergies, Past Medical History, Past Surgical History, Family History and Social History were reviewed in [employer] record.    Objective:   Physical Exam BP 110/70   Pulse (!) 112   Ht 5' 10"  (1.778 m)   Wt 152 lb (68.9 kg)   BMI 21.81 kg/m  Gen: awake, alert, NAD HEENT: anicteric CV: Tachycardic but regular Pulm: CTA b/l Abd: soft, NT/ND, +BS throughout Ext: no c/c/e Neuro: nonfocal  Labs reviewed per careeverywhere  CT abdomen pelvis dated 10/20/2021 with contrast Mild lower right abdominal subcutaneous stranding which may represent subcutaneous contusion.  No discrete hematoma.  Apparent mild circumferential wall thickening of the terminal ileum which may represent acute or chronic enteritis changes.  Given location, Crohn's disease is not excluded.  No evidence of bowel obstruction or inflammatory changes.  Moderate stool within the colon  and rectum.  Aortic atherosclerosis.      Assessment & Plan:  81 year old male with a history of ileal and colonic Crohn's disease which has been predominantly subclinical and was first diagnosed endoscopically in 2002, GERD with gastritis history, B12 and vitamin D deficiency, remote prostate cancer status post prostatectomy, hypertension, hyperlipidemia and recent hyponatremia who is here for follow-up.  Weight loss/Crohn's disease small and large bowel/nausea --he has had  biopsy-proven Crohn's disease dating back to 2002 and I did see Crohn's disease in the ileum and mildly scattered throughout the colon in 2016 when I did his last colonoscopy.  It had been subclinical and therefore we did not treat though we did discuss treatment years ago.  Really in the last 4 months he has had significant nausea, poor appetite, changes in bowel habit.  Hyponatremia is a big issue and to me etiology unclear at this time.  I have recommended the following after lengthy discussion: -- Upper endoscopy and colonoscopy in the outpatient hospital setting; we reviewed the risk, benefits and alternatives and he is agreeable and wishes to proceed; this will be at [facility] on October 4 at 9 AM -- Budesonide 9 mg daily; may need prednisone taper but I am avoiding prednisone for now given significant issues with anxiety and my concern over worsening mental health should we give systemic steroids -- Continue PPI daily as well as ondansetron 4 mg 3 times daily as needed.  2.  Constipation --recent fecal impaction relieved by enema in the ER.  Bowel changes likely Crohn's related and due to significantly decreased oral intake. -- MiraLAX 17 g daily -- As above per #1 -- Can stop the senna for now  3.  Hyponatremia --significant hyponatremia.  Unclear etiology.  My assumption that we will work this up further is that this is hypovolemic hyponatremia.  He is not hyperglycemic.  There is no suspicion of lipemic serum, he is not jaundice and there is no history of plasma cell dyscrasia.  GFR has been not substantially impaired.  He is not on thiazide.  It is possible that this is coming from extrarenal GI losses.  I am going to repeat serum sodium today, check a serum osmolality, a spot urine sodium and a urine osmolality.  Update TSH but check T3 and T4.  I am going to check a random cortisol.  He also may need to see renal.  4.  Anxiety --chronic.  Continue to see Beola Cord.  Continue Cymbalta  60 mg daily.  Can use clonazepam 0.5 mg, 1/2 to 1 tablet every 8 hours as needed  5.  History of B12 deficiency --check serum B12 today  50 minutes total spent today including patient facing time, coordination of care, reviewing medical history/procedures/pertinent radiology studies, and documentation of the encounter.

## 2021-10-22 NOTE — Patient Instructions (Addendum)
Discontinue levofloxacin and metronidazole antibiotics.  Discontinue Senna stool softener.  Continue Miralax 17 grams (1 capful) dissolved in at least 8 ounces water/juice once daily.  Continue sodium tablets, duloxetine, pantoprazole and ondansetron as directed.  We have sent the following medications to your pharmacy for you to pick up at your convenience: Budesonide 9 mg daily  _______________________________________________________  If you are age 79 or older, your body mass index should be between 23-30. Your Body mass index is 21.81 kg/m. If this is out of the aforementioned range listed, please consider follow up with your Primary Care Provider.  If you are age 70 or younger, your body mass index should be between 19-25. Your Body mass index is 21.81 kg/m. If this is out of the aformentioned range listed, please consider follow up with your Primary Care Provider.   ________________________________________________________  The [facility] providers would like to encourage you to use [facility] to communicate with providers for non-urgent requests or questions.  Due to long hold times on the telephone, sending your provider a message by [facility] may be a faster and more efficient way to get a response.  Please allow 48 business hours for a response.  Please remember that this is for non-urgent requests.  _______________________________________________________ Due to recent changes in healthcare laws, you may see the results of your imaging and laboratory studies on MyChart before your provider has had a chance to review them.  We understand that in some cases there may be results that are confusing or concerning to you. Not all laboratory results come back in the same time frame and the provider may be waiting for multiple results in order to interpret others.  Please give [place] 48 hours in order for your provider to thoroughly review all the results before contacting the office for clarification  of your results.

## 2021-10-24 ENCOUNTER — Encounter: Payer: Self-pay | Admitting: Internal Medicine

## 2021-10-24 ENCOUNTER — Ambulatory Visit: Payer: Medicare PPO | Admitting: Internal Medicine

## 2021-10-24 LAB — OSMOLALITY: Osmolality: 256 mOsm/kg — ABNORMAL LOW (ref 278–305)

## 2021-10-24 LAB — T4: T4, Total: 8.9 ug/dL (ref 4.9–10.5)

## 2021-10-24 LAB — SODIUM, URINE, RANDOM: Sodium, Ur: 10 mmol/L — ABNORMAL LOW (ref 28–272)

## 2021-10-24 LAB — T3: T3, Total: 80 ng/dL (ref 76–181)

## 2021-10-25 ENCOUNTER — Ambulatory Visit: Payer: Medicare PPO | Admitting: Psychology

## 2021-10-25 DIAGNOSIS — E871 Hypo-osmolality and hyponatremia: Secondary | ICD-10-CM | POA: Diagnosis not present

## 2021-10-25 DIAGNOSIS — E039 Hypothyroidism, unspecified: Secondary | ICD-10-CM | POA: Diagnosis not present

## 2021-10-26 ENCOUNTER — Other Ambulatory Visit: Payer: Self-pay | Admitting: Internal Medicine

## 2021-10-26 ENCOUNTER — Encounter: Payer: Self-pay | Admitting: Internal Medicine

## 2021-10-26 DIAGNOSIS — K5080 Crohn's disease of both small and large intestine without complications: Secondary | ICD-10-CM

## 2021-10-26 MED ORDER — DICYCLOMINE HCL 20 MG PO TABS: 20.0000 mg | ORAL_TABLET | Freq: Three times a day (TID) | ORAL | 0 refills | Status: DC | PRN

## 2021-10-26 NOTE — Progress Notes (Signed)
Lower abd pain Crohn's

## 2021-10-26 NOTE — H&P (View-Only) (Signed)
Lower abd pain Crohn's

## 2021-10-29 ENCOUNTER — Encounter: Payer: Self-pay | Admitting: Internal Medicine

## 2021-10-31 ENCOUNTER — Other Ambulatory Visit: Payer: Self-pay | Admitting: Internal Medicine

## 2021-10-31 ENCOUNTER — Other Ambulatory Visit: Payer: Self-pay

## 2021-10-31 ENCOUNTER — Encounter (HOSPITAL_COMMUNITY)
Admission: RE | Disposition: A | Discharge status: Home / Self Care | Payer: Self-pay | Source: Home / Self Care | Attending: Internal Medicine

## 2021-10-31 ENCOUNTER — Encounter (HOSPITAL_COMMUNITY): Payer: Self-pay | Admitting: Internal Medicine

## 2021-10-31 ENCOUNTER — Ambulatory Visit (HOSPITAL_BASED_OUTPATIENT_CLINIC_OR_DEPARTMENT_OTHER): Payer: Medicare PPO | Admitting: Anesthesiology

## 2021-10-31 ENCOUNTER — Ambulatory Visit (HOSPITAL_COMMUNITY): Payer: Medicare PPO | Admitting: Anesthesiology

## 2021-10-31 ENCOUNTER — Ambulatory Visit (HOSPITAL_COMMUNITY)
Admission: RE | Admit: 2021-10-31 | Discharge: 2021-10-31 | Disposition: A | Discharge status: Home / Self Care | Payer: Medicare PPO | Attending: Internal Medicine | Admitting: Internal Medicine

## 2021-10-31 DIAGNOSIS — K635 Polyp of colon: Secondary | ICD-10-CM | POA: Diagnosis not present

## 2021-10-31 DIAGNOSIS — K2970 Gastritis, unspecified, without bleeding: Secondary | ICD-10-CM | POA: Insufficient documentation

## 2021-10-31 DIAGNOSIS — Z79899 Other long term (current) drug therapy: Secondary | ICD-10-CM | POA: Diagnosis not present

## 2021-10-31 DIAGNOSIS — R194 Change in bowel habit: Secondary | ICD-10-CM

## 2021-10-31 DIAGNOSIS — K633 Ulcer of intestine: Secondary | ICD-10-CM | POA: Diagnosis not present

## 2021-10-31 DIAGNOSIS — F419 Anxiety disorder, unspecified: Secondary | ICD-10-CM | POA: Insufficient documentation

## 2021-10-31 DIAGNOSIS — Z6822 Body mass index (BMI) 22.0-22.9, adult: Secondary | ICD-10-CM | POA: Insufficient documentation

## 2021-10-31 DIAGNOSIS — K2980 Duodenitis without bleeding: Secondary | ICD-10-CM

## 2021-10-31 DIAGNOSIS — R630 Anorexia: Secondary | ICD-10-CM | POA: Insufficient documentation

## 2021-10-31 DIAGNOSIS — Z9079 Acquired absence of other genital organ(s): Secondary | ICD-10-CM | POA: Insufficient documentation

## 2021-10-31 DIAGNOSIS — Z8546 Personal history of malignant neoplasm of prostate: Secondary | ICD-10-CM | POA: Insufficient documentation

## 2021-10-31 DIAGNOSIS — E559 Vitamin D deficiency, unspecified: Secondary | ICD-10-CM | POA: Diagnosis not present

## 2021-10-31 DIAGNOSIS — I10 Essential (primary) hypertension: Secondary | ICD-10-CM | POA: Insufficient documentation

## 2021-10-31 DIAGNOSIS — K5750 Diverticulosis of both small and large intestine without perforation or abscess without bleeding: Secondary | ICD-10-CM | POA: Diagnosis not present

## 2021-10-31 DIAGNOSIS — R110 Nausea: Secondary | ICD-10-CM

## 2021-10-31 DIAGNOSIS — E039 Hypothyroidism, unspecified: Secondary | ICD-10-CM

## 2021-10-31 DIAGNOSIS — K5090 Crohn's disease, unspecified, without complications: Secondary | ICD-10-CM

## 2021-10-31 DIAGNOSIS — K5730 Diverticulosis of large intestine without perforation or abscess without bleeding: Secondary | ICD-10-CM | POA: Diagnosis not present

## 2021-10-31 DIAGNOSIS — K5710 Diverticulosis of small intestine without perforation or abscess without bleeding: Secondary | ICD-10-CM | POA: Diagnosis not present

## 2021-10-31 DIAGNOSIS — K5900 Constipation, unspecified: Secondary | ICD-10-CM | POA: Diagnosis not present

## 2021-10-31 DIAGNOSIS — E538 Deficiency of other specified B group vitamins: Secondary | ICD-10-CM | POA: Insufficient documentation

## 2021-10-31 DIAGNOSIS — R198 Other specified symptoms and signs involving the digestive system and abdomen: Secondary | ICD-10-CM

## 2021-10-31 DIAGNOSIS — E871 Hypo-osmolality and hyponatremia: Secondary | ICD-10-CM

## 2021-10-31 DIAGNOSIS — K5080 Crohn's disease of both small and large intestine without complications: Secondary | ICD-10-CM

## 2021-10-31 DIAGNOSIS — K219 Gastro-esophageal reflux disease without esophagitis: Secondary | ICD-10-CM | POA: Diagnosis not present

## 2021-10-31 DIAGNOSIS — E785 Hyperlipidemia, unspecified: Secondary | ICD-10-CM | POA: Diagnosis not present

## 2021-10-31 DIAGNOSIS — R634 Abnormal weight loss: Secondary | ICD-10-CM

## 2021-10-31 DIAGNOSIS — R935 Abnormal findings on diagnostic imaging of other abdominal regions, including retroperitoneum: Secondary | ICD-10-CM

## 2021-10-31 DIAGNOSIS — K6389 Other specified diseases of intestine: Secondary | ICD-10-CM | POA: Diagnosis not present

## 2021-10-31 DIAGNOSIS — K648 Other hemorrhoids: Secondary | ICD-10-CM | POA: Diagnosis not present

## 2021-10-31 DIAGNOSIS — K50018 Crohn's disease of small intestine with other complication: Secondary | ICD-10-CM | POA: Diagnosis not present

## 2021-10-31 HISTORY — PX: COLONOSCOPY WITH PROPOFOL: SHX5780

## 2021-10-31 HISTORY — PX: ESOPHAGOGASTRODUODENOSCOPY (EGD) WITH PROPOFOL: SHX5813

## 2021-10-31 HISTORY — PX: BIOPSY: SHX5522

## 2021-10-31 SURGERY — ESOPHAGOGASTRODUODENOSCOPY (EGD) WITH PROPOFOL
Anesthesia: Monitor Anesthesia Care

## 2021-10-31 MED ORDER — LIDOCAINE HCL (CARDIAC) PF 100 MG/5ML IV SOSY
PREFILLED_SYRINGE | INTRAVENOUS | Status: DC | PRN
  Administered 2021-10-31: 20 mg via INTRAVENOUS
  Administered 2021-10-31: 80 mg via INTRAVENOUS

## 2021-10-31 MED ORDER — SODIUM CHLORIDE 0.9 % IV SOLN: INTRAVENOUS | Status: DC

## 2021-10-31 MED ORDER — PROPOFOL 10 MG/ML IV BOLUS
INTRAVENOUS | Status: DC | PRN
  Administered 2021-10-31: 10 mg via INTRAVENOUS
  Administered 2021-10-31 (×2): 20 mg via INTRAVENOUS
  Administered 2021-10-31: 50 mg via INTRAVENOUS

## 2021-10-31 MED ORDER — LACTATED RINGERS IV SOLN: INTRAVENOUS | Status: DC | PRN

## 2021-10-31 MED ORDER — PROPOFOL 500 MG/50ML IV EMUL
INTRAVENOUS | Status: DC | PRN
  Administered 2021-10-31: 150 ug/kg/min via INTRAVENOUS

## 2021-10-31 MED ORDER — EPHEDRINE SULFATE-NACL 50-0.9 MG/10ML-% IV SOSY
PREFILLED_SYRINGE | INTRAVENOUS | Status: DC | PRN
  Administered 2021-10-31: 5 mg via INTRAVENOUS

## 2021-10-31 SURGICAL SUPPLY — 25 items

## 2021-10-31 NOTE — Op Note (Signed)
[facility] Patient Name: Ralph Perkins Procedure Date : 10/31/2021 MRN: [id number] Attending MD: Jerene Bears , MD Date of Birth: 1940-06-22 CSN: [id number] Age: 81 Admit Type: Inpatient Procedure:                Colonoscopy Indications:              Disease activity assessment of Crohn's disease of                            the small bowel and colon, Change in bowel habits,                            Weight loss, previously subclinical Crohn's not on                            therapy, Entocort 9 mg daily started 8 days ago Providers:                Lajuan Lines. Hilarie Fredrickson, MD, Jaci Carrel, RN, Benetta Spar, Technician Referring MD:             Gwenyth Bouillon. Scott Medicines:                Monitored Anesthesia Care Complications:            No immediate complications. Estimated Blood Loss:     Estimated blood loss was minimal. Procedure:                Pre-Anesthesia Assessment:                           - Prior to the procedure, a History and Physical                            was performed, and patient medications and                            allergies were reviewed. The patient's tolerance of                            previous anesthesia was also reviewed. The risks                            and benefits of the procedure and the sedation                            options and risks were discussed with the patient.                            All questions were answered, and informed consent                            was obtained. Prior Anticoagulants: The patient has  taken no previous anticoagulant or antiplatelet                            agents. ASA Grade Assessment: II - A patient with                            mild systemic disease. After reviewing the risks                            and benefits, the patient was deemed in                            satisfactory condition to undergo the procedure.                            After obtaining informed consent, the colonoscope                            was passed under direct vision. Throughout the                            procedure, the patient's blood pressure, pulse, and                            oxygen saturations were monitored continuously. The                            [id number]-HQ190TL ([id number] Olympus peds colonoscope was                            introduced through the anus and advanced to the                            cecum, identified by appendiceal orifice and                            ileocecal valve. The colonoscopy was performed                            without difficulty. The patient tolerated the                            procedure well. The quality of the bowel                            preparation was excellent. The terminal ileum,                            ileocecal valve, appendiceal orifice, and rectum                            were photographed. Scope In: 9:19:16 AM Scope Out: 9:38:37 AM Scope Withdrawal Time: 0 hours 14 minutes 16 seconds  Total Procedure Duration: 0 hours 19 minutes 20 seconds  Findings:      Inflammation, moderate in severity and characterized by erosions,       erythema and deep ulcerations was found in the terminal ileum       immediately at IC valve. There is stricturing at this area and the       pediatric colonoscope could not be advanced into the ileum. Views were       obtained of the terminal ileum as described. Biopsies were taken with a       cold forceps for histology.      Normal mucosa was found in the proximal rectum, in the mid rectum, in       the recto-sigmoid colon, in the sigmoid colon, in the descending colon,       in the transverse colon, at the hepatic flexure, in the ascending colon       and in the cecum. Biopsies were taken with a cold forceps for histology       (right and left colon in separate jars given hx of Crohn's colitis).      Localized mild mucosal changes  characterized by congestion (edema),       erosions and granularity were found in the distal rectum likely related       to Crohn's disease. Biopsies were taken with a cold forceps for       histology.      Multiple small and large-mouthed diverticula were found in the sigmoid       colon, descending colon, transverse colon and hepatic flexure.      Internal hemorrhoids were found during retroflexion. The hemorrhoids       were small. Impression:               - Crohn's disease with ileitis. Moderate ileal                            inflammation and stricturing at ICV. Biopsied.                           - Normal mucosa from proximal rectum to cecum                            without endoscopic evidence of active Crohn's                            colitis today. Biopsied (right and left colon jars).                           - Localized mild mucosal changes were found in the                            distal rectum most likely secondary to Crohn's                            disease. Biopsied (distal rectum jar).                           - Moderate diverticulosis in the sigmoid colon, in  the descending colon, in the transverse colon and                            at the hepatic flexure.                           - Small internal hemorrhoids. Moderate Sedation:      N/A Recommendation:           - Patient has a contact number available for                            emergencies. The signs and symptoms of potential                            delayed complications were discussed with the                            patient. Return to normal activities tomorrow.                            Written discharge instructions were provided to the                            patient.                           - Resume previous diet. Given active ileitis lower                            residue diet recommended, continue high protein                            foods and  liquid supplements. Avoid NSAIDs. Avoid                            processed sugars when possible.                           - Continue present medications including                            ileal-release budesonide 9 mg daily x 8 weeks (if                            not improving in 1-2 weeks may consider changing to                            prednisone taper). We will need to decide if                            escalation of therapy is needed based no response.                           - Await pathology results.                           -  No recommendation at this time regarding repeat                            colonoscopy due to age (at next surveillance                            interval).                           - Continue to closely monitor sodium. At this time                            GI losses (hypovolumic hyponatremia given low urine                            Na with intact renal function) is felt most likely                            to explain hyponatremia.                           - Return to GI clinic at appointment to be                            scheduled. Procedure Code(s):        --- Professional ---                           [phone], Colonoscopy, flexible; with biopsy, single                            or multiple Diagnosis Code(s):        --- Professional ---                           K50.00, Crohn's disease of small intestine without                            complications                           K64.8, Other hemorrhoids                           K50.80, Crohn's disease of both small and large                            intestine without complications                           R19.4, Change in bowel habit                           R63.4, Abnormal weight loss                           K57.30, Diverticulosis of large intestine without  perforation or abscess without bleeding CPT copyright 2019 American Medical Association. All  rights reserved. The codes documented in this report are preliminary and upon coder review may  be revised to meet current compliance requirements. Jerene Bears, MD 10/31/2021 9:54:59 AM This report has been signed electronically. Number of Addenda: 0

## 2021-10-31 NOTE — Discharge Instructions (Signed)

## 2021-10-31 NOTE — Transfer of Care (Signed)
Immediate Anesthesia Transfer of Care Note  Patient: Ralph Perkins.  Procedure(s) Performed: ESOPHAGOGASTRODUODENOSCOPY (EGD) WITH PROPOFOL COLONOSCOPY WITH PROPOFOL BIOPSY  Patient Location: PACU  Anesthesia Type:MAC  Level of Consciousness: drowsy and patient cooperative  Airway & Oxygen Therapy: Patient Spontanous Breathing  Post-op Assessment: Report given to RN and Post -op Vital signs reviewed and stable  Post vital signs: Reviewed and stable  Last Vitals:  Vitals Value Taken Time  BP    Temp    Pulse    Resp    SpO2      Last Pain:  Vitals:   10/31/21 0824  TempSrc: Temporal  PainSc: 0-No pain         Complications: No notable events documented.

## 2021-10-31 NOTE — Anesthesia Preprocedure Evaluation (Addendum)
Anesthesia Evaluation  Patient identified by MRN, date of birth, ID band Patient awake    Reviewed: Allergy & Precautions, NPO status , Patient's Chart, lab work & pertinent test results  Airway Mallampati: II  TM Distance: >3 FB     Dental   Pulmonary neg pulmonary ROS,    breath sounds clear to auscultation       Cardiovascular hypertension, Pt. on medications  Rhythm:Regular Rate:Normal     Neuro/Psych Anxiety negative neurological ROS     GI/Hepatic Neg liver ROS, crohns dz   Endo/Other  Hypothyroidism   Renal/GU negative Renal ROS     Musculoskeletal   Abdominal   Peds  Hematology negative hematology ROS (+)   Anesthesia Other Findings   Reproductive/Obstetrics                             Lab Results  Component Value Date   WBC 8.8 05/22/2015   HGB 15.3 05/22/2015   HCT 45.7 05/22/2015   MCV 87.7 05/22/2015   PLT 268.0 05/22/2015   Lab Results  Component Value Date   CREATININE 0.92 10/22/2021   BUN 12 10/22/2021   NA 123 (L) 10/22/2021   K 4.0 10/22/2021   CL 88 (L) 10/22/2021   CO2 26 10/22/2021    Anesthesia Physical Anesthesia Plan  ASA: 3  Anesthesia Plan: MAC   Post-op Pain Management:    Induction:   PONV Risk Score and Plan: 1 and Propofol infusion, Ondansetron and Treatment may vary due to age or medical condition  Airway Management Planned: Natural Airway and Nasal Cannula  Additional Equipment:   Intra-op Plan:   Post-operative Plan:   Informed Consent: I have reviewed the patients History and Physical, chart, labs and discussed the procedure including the risks, benefits and alternatives for the proposed anesthesia with the patient or authorized representative who has indicated his/her understanding and acceptance.       Plan Discussed with:   Anesthesia Plan Comments:        Anesthesia Quick Evaluation

## 2021-10-31 NOTE — Op Note (Signed)
[facility] Patient Name: Ralph Perkins Procedure Date : 10/31/2021 MRN: [id number] Attending MD: Jerene Bears , MD Date of Birth: 1940-06-23 CSN: [id number] Age: 81 Admit Type: Inpatient Procedure:                Upper GI endoscopy Indications:              Crohn's disease, Anorexia, Nausea, Weight loss,                            currently on PPI and on budesonide 9 mg daily over                            last 8 days Providers:                Lajuan Lines. Hilarie Fredrickson, MD, Jaci Carrel, RN, Benetta Spar, Technician Referring MD:             Gwenyth Bouillon. Scott Medicines:                Monitored Anesthesia Care Complications:            No immediate complications. Estimated Blood Loss:     Estimated blood loss was minimal. Procedure:                Pre-Anesthesia Assessment:                           - Prior to the procedure, a History and Physical                            was performed, and patient medications and                            allergies were reviewed. The patient's tolerance of                            previous anesthesia was also reviewed. The risks                            and benefits of the procedure and the sedation                            options and risks were discussed with the patient.                            All questions were answered, and informed consent                            was obtained. Prior Anticoagulants: The patient has                            taken no previous anticoagulant or antiplatelet  agents. ASA Grade Assessment: II - A patient with                            mild systemic disease. After reviewing the risks                            and benefits, the patient was deemed in                            satisfactory condition to undergo the procedure.                           After obtaining informed consent, the endoscope was                            passed under  direct vision. Throughout the                            procedure, the patient's blood pressure, pulse, and                            oxygen saturations were monitored continuously. The                            [id number]-[id number] ([id number]) Olympus endoscope was introduced                            through the mouth, and advanced to the third part                            of duodenum. The upper GI endoscopy was                            accomplished without difficulty. The patient                            tolerated the procedure well. Scope In: Scope Out: Findings:      The examined esophagus was normal.      The cardia and gastric fundus were normal on retroflexion.      Patchy mild inflammation characterized by erythema was found in the       gastric antrum. Biopsies were taken with a cold forceps for histology       and Helicobacter pylori testing.      Normal mucosa was found in the entire duodenum. Biopsies were taken with       a cold forceps for histology.      A medium non-bleeding diverticulum was found in the second portion of       the duodenum. Impression:               - Normal esophagus.                           - Mild, patchy, gastritis. Biopsied.                           -  Normal mucosa was found in the entire examined                            duodenum. Biopsied.                           - Non-bleeding duodenal diverticulum. Moderate Sedation:      N/A Recommendation:           - Patient has a contact number available for                            emergencies. The signs and symptoms of potential                            delayed complications were discussed with the                            patient. Return to normal activities tomorrow.                            Written discharge instructions were provided to the                            patient.                           - Resume previous diet.                           - Continue present medications.                            - Await pathology results.                           - See the other procedure note for documentation of                            additional recommendations. Procedure Code(s):        --- Professional ---                           [phone], Esophagogastroduodenoscopy, flexible,                            transoral; with biopsy, single or multiple Diagnosis Code(s):        --- Professional ---                           K29.70, Gastritis, unspecified, without bleeding                           K50.90, Crohn's disease, unspecified, without                            complications  R63.0, Anorexia                           R11.0, Nausea                           R63.4, Abnormal weight loss                           K57.10, Diverticulosis of small intestine without                            perforation or abscess without bleeding CPT copyright 2019 American Medical Association. All rights reserved. The codes documented in this report are preliminary and upon coder review may  be revised to meet current compliance requirements. Jerene Bears, MD 10/31/2021 9:17:35 AM This report has been signed electronically. Number of Addenda: 0

## 2021-10-31 NOTE — Interval H&P Note (Signed)
History and Physical Interval Note: For EGD/colon today.  Poor appetite, altered BMs, nausea, Crohn's. The nature of the procedure, as well as the risks, benefits, and alternatives were carefully and thoroughly reviewed with the patient. Ample time for discussion and questions allowed. The patient understood, was satisfied, and agreed to proceed.    10/31/2021 8:54 AM  Verl Blalock.  has presented today for surgery, with the diagnosis of hyponatremia; crohns;.  The various methods of treatment have been discussed with the patient and family. After consideration of risks, benefits and other options for treatment, the patient has consented to  Procedure(s): ESOPHAGOGASTRODUODENOSCOPY (EGD) WITH PROPOFOL (N/A) COLONOSCOPY WITH PROPOFOL (N/A) as a surgical intervention.  The patient's history has been reviewed, patient examined, no change in status, stable for surgery.  I have reviewed the patient's chart and labs.  Questions were answered to the patient's satisfaction.     Lajuan Lines Dunbar Buras

## 2021-11-01 ENCOUNTER — Encounter: Payer: Self-pay | Admitting: Internal Medicine

## 2021-11-01 ENCOUNTER — Ambulatory Visit: Payer: Medicare PPO | Admitting: Psychology

## 2021-11-01 ENCOUNTER — Other Ambulatory Visit: Payer: Self-pay

## 2021-11-01 MED ORDER — BUDESONIDE 3 MG PO CPEP: 9.0000 mg | ORAL_CAPSULE | Freq: Every day | ORAL | 0 refills | Status: DC

## 2021-11-01 NOTE — Anesthesia Postprocedure Evaluation (Signed)
Anesthesia Post Note  Patient: Nakeem Murnane.  Procedure(s) Performed: ESOPHAGOGASTRODUODENOSCOPY (EGD) WITH PROPOFOL COLONOSCOPY WITH PROPOFOL BIOPSY     Patient location during evaluation: PACU Anesthesia Type: MAC Level of consciousness: awake and alert Pain management: pain level controlled Vital Signs Assessment: post-procedure vital signs reviewed and stable Respiratory status: spontaneous breathing, nonlabored ventilation, respiratory function stable and patient connected to nasal cannula oxygen Cardiovascular status: stable and blood pressure returned to baseline Postop Assessment: no apparent nausea or vomiting Anesthetic complications: no   No notable events documented.  Last Vitals:  Vitals:   10/31/21 0945 10/31/21 1010  BP: 119/75 (!) (P) 159/86  Pulse: 68   Resp: 20   Temp: 36.6 C 36.4 C  SpO2:      Last Pain:  Vitals:   10/31/21 1010  TempSrc:   PainSc: 0-No pain                 Tiajuana Amass

## 2021-11-02 ENCOUNTER — Other Ambulatory Visit (INDEPENDENT_AMBULATORY_CARE_PROVIDER_SITE_OTHER): Payer: Medicare PPO

## 2021-11-02 ENCOUNTER — Other Ambulatory Visit: Payer: Self-pay

## 2021-11-02 DIAGNOSIS — E039 Hypothyroidism, unspecified: Secondary | ICD-10-CM

## 2021-11-02 DIAGNOSIS — E871 Hypo-osmolality and hyponatremia: Secondary | ICD-10-CM

## 2021-11-02 LAB — BASIC METABOLIC PANEL
BUN: 10 mg/dL (ref 6–23)
CO2: 32 mEq/L (ref 19–32)
Calcium: 9.3 mg/dL (ref 8.4–10.5)
Chloride: 90 mEq/L — ABNORMAL LOW (ref 96–112)
Creatinine, Ser: 0.84 mg/dL (ref 0.40–1.50)
GFR: 83 mL/min (ref >60.00)
Glucose, Bld: 137 mg/dL — ABNORMAL HIGH (ref 70–99)
Potassium: 3.5 mEq/L (ref 3.5–5.1)
Sodium: 128 mEq/L — ABNORMAL LOW (ref 135–145)

## 2021-11-02 LAB — SURGICAL PATHOLOGY

## 2021-11-02 LAB — TSH: TSH: 2.14 u[IU]/mL (ref 0.35–5.50)

## 2021-11-04 ENCOUNTER — Encounter (HOSPITAL_COMMUNITY): Payer: Self-pay | Admitting: Internal Medicine

## 2021-11-08 ENCOUNTER — Ambulatory Visit: Payer: Medicare PPO | Admitting: Psychology

## 2021-11-09 ENCOUNTER — Other Ambulatory Visit (INDEPENDENT_AMBULATORY_CARE_PROVIDER_SITE_OTHER): Payer: Medicare PPO

## 2021-11-09 DIAGNOSIS — E871 Hypo-osmolality and hyponatremia: Secondary | ICD-10-CM

## 2021-11-09 LAB — BASIC METABOLIC PANEL
BUN: 10 mg/dL (ref 6–23)
CO2: 32 mEq/L (ref 19–32)
Calcium: 9.5 mg/dL (ref 8.4–10.5)
Chloride: 92 mEq/L — ABNORMAL LOW (ref 96–112)
Creatinine, Ser: 0.81 mg/dL (ref 0.40–1.50)
GFR: 83.9 mL/min (ref >60.00)
Glucose, Bld: 127 mg/dL — ABNORMAL HIGH (ref 70–99)
Potassium: 3.6 mEq/L (ref 3.5–5.1)
Sodium: 131 mEq/L — ABNORMAL LOW (ref 135–145)

## 2021-11-12 ENCOUNTER — Other Ambulatory Visit: Payer: Self-pay

## 2021-11-12 DIAGNOSIS — K5080 Crohn's disease of both small and large intestine without complications: Secondary | ICD-10-CM

## 2021-11-12 DIAGNOSIS — E871 Hypo-osmolality and hyponatremia: Secondary | ICD-10-CM

## 2021-11-13 ENCOUNTER — Other Ambulatory Visit: Payer: Self-pay | Admitting: Internal Medicine

## 2021-11-13 DIAGNOSIS — Z23 Encounter for immunization: Secondary | ICD-10-CM | POA: Diagnosis not present

## 2021-11-15 ENCOUNTER — Ambulatory Visit: Payer: Medicare PPO | Admitting: Psychology

## 2021-11-17 ENCOUNTER — Other Ambulatory Visit: Payer: Self-pay | Admitting: Internal Medicine

## 2021-11-22 ENCOUNTER — Ambulatory Visit: Payer: Medicare PPO | Admitting: Psychology

## 2021-11-23 ENCOUNTER — Other Ambulatory Visit: Payer: Self-pay

## 2021-11-23 ENCOUNTER — Other Ambulatory Visit (INDEPENDENT_AMBULATORY_CARE_PROVIDER_SITE_OTHER): Payer: Medicare PPO

## 2021-11-23 DIAGNOSIS — E871 Hypo-osmolality and hyponatremia: Secondary | ICD-10-CM

## 2021-11-23 DIAGNOSIS — R5383 Other fatigue: Secondary | ICD-10-CM

## 2021-11-23 DIAGNOSIS — K5080 Crohn's disease of both small and large intestine without complications: Secondary | ICD-10-CM

## 2021-11-23 DIAGNOSIS — F411 Generalized anxiety disorder: Secondary | ICD-10-CM

## 2021-11-23 LAB — CBC WITH DIFFERENTIAL/PLATELET
Basophils Absolute: 0 10*3/uL (ref 0.0–0.1)
Basophils Relative: 0.6 % (ref 0.0–3.0)
Eosinophils Absolute: 0.1 10*3/uL (ref 0.0–0.7)
Eosinophils Relative: 1.7 % (ref 0.0–5.0)
HCT: 42.5 % (ref 39.0–52.0)
Hemoglobin: 14.5 g/dL (ref 13.0–17.0)
Lymphocytes Relative: 18.4 % (ref 12.0–46.0)
Lymphs Abs: 1.1 10*3/uL (ref 0.7–4.0)
MCHC: 34.1 g/dL (ref 30.0–36.0)
MCV: 93.3 fl (ref 78.0–100.0)
Monocytes Absolute: 0.8 10*3/uL (ref 0.1–1.0)
Monocytes Relative: 12.8 % — ABNORMAL HIGH (ref 3.0–12.0)
Neutro Abs: 3.9 10*3/uL (ref 1.4–7.7)
Neutrophils Relative %: 66.5 % (ref 43.0–77.0)
Platelets: 244 10*3/uL (ref 150.0–400.0)
RBC: 4.56 Mil/uL (ref 4.22–5.81)
RDW: 13.2 % (ref 11.5–15.5)
WBC: 5.8 10*3/uL (ref 4.0–10.5)

## 2021-11-23 LAB — COMPREHENSIVE METABOLIC PANEL
ALT: 11 U/L (ref 0–53)
AST: 12 U/L (ref 0–37)
Albumin: 4.3 g/dL (ref 3.5–5.2)
Alkaline Phosphatase: 56 U/L (ref 39–117)
BUN: 12 mg/dL (ref 6–23)
CO2: 31 mEq/L (ref 19–32)
Calcium: 9.3 mg/dL (ref 8.4–10.5)
Chloride: 92 mEq/L — ABNORMAL LOW (ref 96–112)
Creatinine, Ser: 0.78 mg/dL (ref 0.40–1.50)
GFR: 84.84 mL/min (ref >60.00)
Glucose, Bld: 102 mg/dL — ABNORMAL HIGH (ref 70–99)
Potassium: 3.3 mEq/L — ABNORMAL LOW (ref 3.5–5.1)
Sodium: 130 mEq/L — ABNORMAL LOW (ref 135–145)
Total Bilirubin: 0.8 mg/dL (ref 0.2–1.2)
Total Protein: 6.6 g/dL (ref 6.0–8.3)

## 2021-11-23 NOTE — Addendum Note (Signed)
Addended by: Jodi Marble, Aaima Gaddie I on: 11/23/2021 08:46 AM   Modules accepted: Orders

## 2021-11-23 NOTE — Addendum Note (Signed)
Addended by: Jodi Marble, Brayson Livesey I on: 11/23/2021 08:46 AM   Modules accepted: Orders

## 2021-11-28 ENCOUNTER — Other Ambulatory Visit (INDEPENDENT_AMBULATORY_CARE_PROVIDER_SITE_OTHER): Payer: Medicare PPO

## 2021-11-28 ENCOUNTER — Encounter: Payer: Self-pay | Admitting: Internal Medicine

## 2021-11-28 ENCOUNTER — Other Ambulatory Visit: Payer: Self-pay

## 2021-11-28 ENCOUNTER — Other Ambulatory Visit: Payer: Medicare PPO

## 2021-11-28 DIAGNOSIS — R5383 Other fatigue: Secondary | ICD-10-CM

## 2021-11-28 DIAGNOSIS — E871 Hypo-osmolality and hyponatremia: Secondary | ICD-10-CM

## 2021-11-28 LAB — BASIC METABOLIC PANEL
BUN: 14 mg/dL (ref 6–23)
CO2: 31 mEq/L (ref 19–32)
Calcium: 9.6 mg/dL (ref 8.4–10.5)
Chloride: 93 mEq/L — ABNORMAL LOW (ref 96–112)
Creatinine, Ser: 0.74 mg/dL (ref 0.40–1.50)
GFR: 86.19 mL/min (ref >60.00)
Glucose, Bld: 110 mg/dL — ABNORMAL HIGH (ref 70–99)
Potassium: 3.8 mEq/L (ref 3.5–5.1)
Sodium: 131 mEq/L — ABNORMAL LOW (ref 135–145)

## 2021-11-28 LAB — FERRITIN: Ferritin: 87.4 ng/mL (ref 22.0–322.0)

## 2021-11-28 NOTE — Telephone Encounter (Signed)
Add BMP on with our lab, see message

## 2021-11-29 ENCOUNTER — Ambulatory Visit: Payer: Medicare PPO | Admitting: Psychology

## 2021-11-29 LAB — TESTOSTERONE TOTAL,FREE,BIO, MALES
Albumin: 4.3 g/dL (ref 3.6–5.1)
Sex Hormone Binding: 36 nmol/L (ref 22–77)
Testosterone: 200 ng/dL — ABNORMAL LOW (ref 250–827)

## 2021-12-03 ENCOUNTER — Ambulatory Visit: Payer: Medicare PPO | Admitting: Internal Medicine

## 2021-12-03 ENCOUNTER — Encounter: Payer: Self-pay | Admitting: Internal Medicine

## 2021-12-03 DIAGNOSIS — Z23 Encounter for immunization: Secondary | ICD-10-CM | POA: Diagnosis not present

## 2021-12-06 ENCOUNTER — Ambulatory Visit: Payer: Medicare PPO | Admitting: Psychology

## 2021-12-13 ENCOUNTER — Ambulatory Visit: Payer: Medicare PPO | Admitting: Psychology

## 2021-12-14 ENCOUNTER — Other Ambulatory Visit: Payer: Self-pay

## 2021-12-14 ENCOUNTER — Encounter: Payer: Self-pay | Admitting: Internal Medicine

## 2021-12-14 ENCOUNTER — Other Ambulatory Visit (INDEPENDENT_AMBULATORY_CARE_PROVIDER_SITE_OTHER): Payer: Medicare PPO

## 2021-12-14 DIAGNOSIS — E871 Hypo-osmolality and hyponatremia: Secondary | ICD-10-CM

## 2021-12-14 DIAGNOSIS — R5383 Other fatigue: Secondary | ICD-10-CM

## 2021-12-14 LAB — COMPREHENSIVE METABOLIC PANEL
ALT: 13 U/L (ref 0–53)
AST: 14 U/L (ref 0–37)
Albumin: 4.4 g/dL (ref 3.5–5.2)
Alkaline Phosphatase: 58 U/L (ref 39–117)
BUN: 17 mg/dL (ref 6–23)
CO2: 32 mEq/L (ref 19–32)
Calcium: 9.6 mg/dL (ref 8.4–10.5)
Chloride: 93 mEq/L — ABNORMAL LOW (ref 96–112)
Creatinine, Ser: 0.8 mg/dL (ref 0.40–1.50)
GFR: 84.16 mL/min (ref >60.00)
Glucose, Bld: 97 mg/dL (ref 70–99)
Potassium: 4.3 mEq/L (ref 3.5–5.1)
Sodium: 132 mEq/L — ABNORMAL LOW (ref 135–145)
Total Bilirubin: 0.5 mg/dL (ref 0.2–1.2)
Total Protein: 6.7 g/dL (ref 6.0–8.3)

## 2021-12-14 LAB — MAGNESIUM: Magnesium: 2 mg/dL (ref 1.5–2.5)

## 2021-12-14 MED ORDER — PANTOPRAZOLE SODIUM 20 MG PO TBEC: 20.0000 mg | DELAYED_RELEASE_TABLET | Freq: Two times a day (BID) | ORAL | 3 refills | Status: DC

## 2021-12-27 ENCOUNTER — Ambulatory Visit: Payer: Medicare PPO | Admitting: Psychology

## 2022-01-03 ENCOUNTER — Ambulatory Visit: Payer: Medicare PPO | Admitting: Psychology

## 2022-01-07 ENCOUNTER — Encounter: Payer: Self-pay | Admitting: *Deleted

## 2022-01-10 ENCOUNTER — Ambulatory Visit: Payer: Medicare PPO | Admitting: Psychology

## 2022-01-16 ENCOUNTER — Other Ambulatory Visit (INDEPENDENT_AMBULATORY_CARE_PROVIDER_SITE_OTHER): Payer: Medicare PPO

## 2022-01-16 ENCOUNTER — Encounter: Payer: Self-pay | Admitting: Internal Medicine

## 2022-01-16 ENCOUNTER — Ambulatory Visit: Payer: Medicare PPO | Admitting: Internal Medicine

## 2022-01-16 VITALS — BP 160/100 | HR 148 | Ht 70.0 in | Wt 149.0 lb

## 2022-01-16 DIAGNOSIS — E871 Hypo-osmolality and hyponatremia: Secondary | ICD-10-CM | POA: Diagnosis not present

## 2022-01-16 DIAGNOSIS — K5090 Crohn's disease, unspecified, without complications: Secondary | ICD-10-CM

## 2022-01-16 DIAGNOSIS — R110 Nausea: Secondary | ICD-10-CM | POA: Diagnosis not present

## 2022-01-16 DIAGNOSIS — K5900 Constipation, unspecified: Secondary | ICD-10-CM | POA: Diagnosis not present

## 2022-01-16 DIAGNOSIS — E538 Deficiency of other specified B group vitamins: Secondary | ICD-10-CM

## 2022-01-16 DIAGNOSIS — K5080 Crohn's disease of both small and large intestine without complications: Secondary | ICD-10-CM

## 2022-01-16 LAB — CBC WITH DIFFERENTIAL/PLATELET
Basophils Absolute: 0 10*3/uL (ref 0.0–0.1)
Basophils Relative: 0.3 % (ref 0.0–3.0)
Eosinophils Absolute: 0.1 10*3/uL (ref 0.0–0.7)
Eosinophils Relative: 1.4 % (ref 0.0–5.0)
HCT: 45.5 % (ref 39.0–52.0)
Hemoglobin: 15.6 g/dL (ref 13.0–17.0)
Lymphocytes Relative: 8 % — ABNORMAL LOW (ref 12.0–46.0)
Lymphs Abs: 0.7 10*3/uL (ref 0.7–4.0)
MCHC: 34.3 g/dL (ref 30.0–36.0)
MCV: 93.2 fl (ref 78.0–100.0)
Monocytes Absolute: 1 10*3/uL (ref 0.1–1.0)
Monocytes Relative: 11.3 % (ref 3.0–12.0)
Neutro Abs: 7.3 10*3/uL (ref 1.4–7.7)
Neutrophils Relative %: 79 % — ABNORMAL HIGH (ref 43.0–77.0)
Platelets: 270 10*3/uL (ref 150.0–400.0)
RBC: 4.89 Mil/uL (ref 4.22–5.81)
RDW: 12.7 % (ref 11.5–15.5)
WBC: 9.3 10*3/uL (ref 4.0–10.5)

## 2022-01-16 LAB — BASIC METABOLIC PANEL
BUN: 17 mg/dL (ref 6–23)
CO2: 32 mEq/L (ref 19–32)
Calcium: 9.7 mg/dL (ref 8.4–10.5)
Chloride: 91 mEq/L — ABNORMAL LOW (ref 96–112)
Creatinine, Ser: 0.78 mg/dL (ref 0.40–1.50)
GFR: 84.76 mL/min (ref >60.00)
Glucose, Bld: 112 mg/dL — ABNORMAL HIGH (ref 70–99)
Potassium: 5.1 mEq/L (ref 3.5–5.1)
Sodium: 130 mEq/L — ABNORMAL LOW (ref 135–145)

## 2022-01-16 LAB — VITAMIN B12: Vitamin B-12: 455 pg/mL (ref 211–911)

## 2022-01-16 LAB — FERRITIN: Ferritin: 53.5 ng/mL (ref 22.0–322.0)

## 2022-01-16 MED ORDER — ONDANSETRON HCL 4 MG PO TABS: 4.0000 mg | ORAL_TABLET | Freq: Three times a day (TID) | ORAL | 1 refills | Status: DC | PRN

## 2022-01-16 NOTE — Patient Instructions (Addendum)
Your provider has requested that you go to the basement level for lab work before leaving today. Press "B" on the elevator. The lab is located at the first door on the left as you exit the elevator.  Discontinue Budesonide when your current prescription runs out.  Discontinue pantoprazole.  Continue Miralax as directed.  Continue Zofran (ondansetron) as needed.  Please follow up with Dr Hilarie Fredrickson in the office in 3 months.  _______________________________________________________  If you are age 31 or older, your body mass index should be between 23-30. Your Body mass index is 21.38 kg/m. If this is out of the aforementioned range listed, please consider follow up with your Primary Care Provider.  If you are age 35 or younger, your body mass index should be between 19-25. Your Body mass index is 21.38 kg/m. If this is out of the aformentioned range listed, please consider follow up with your Primary Care Provider.   ________________________________________________________  The [facility] providers would like to encourage you to use [facility] to communicate with providers for non-urgent requests or questions.  Due to long hold times on the telephone, sending your provider a message by [facility] may be a faster and more efficient way to get a response.  Please allow 48 business hours for a response.  Please remember that this is for non-urgent requests.  _______________________________________________________  Due to recent changes in healthcare laws, you may see the results of your imaging and laboratory studies on MyChart before your provider has had a chance to review them.  We understand that in some cases there may be results that are confusing or concerning to you. Not all laboratory results come back in the same time frame and the provider may be waiting for multiple results in order to interpret others.  Please give [place] 48 hours in order for your provider to thoroughly review all the results  before contacting the office for clarification of your results.

## 2022-01-16 NOTE — Progress Notes (Signed)
Subjective:    Patient ID: Ralph Blalock., male    DOB: 03-14-1940, 81 y.o.   MRN: [id number]  HPI Ralph Perkins is a 82 year old male with a history of ileal and colonic Crohn's disease (dx 2002), GERD with history of gastritis, B12 and vitamin D deficiency, hypertension, hyperlipidemia, remote prostate cancer status post resection, anxiety and recent hyponatremia who is here for follow-up.  He is here today with his wife.  He came for upper endoscopy and colonoscopy which I performed on 10/31/2021. EGD: Patchy mild inflammation in the gastric antrum which was biopsied.  Medium nonbleeding second portion of the duodenum diverticulum.  Pathology stomach biopsies normal without H. pylori.  Duodenal biopsies normal. Colonoscopy: Moderate inflammation with erosions, erythema and deep ulcerations in the terminal ileum with stricturing.  Normal colonic mucosa with the exception of mild mucosal changes in the very distal rectum.  Moderate diverticulosis.  Small hemorrhoids.  Path: Ileal biopsies chronic severely active ileitis; CMV negative.  Right colon biopsies normal.  Left colon biopsy normal.  Rectal biopsy mild reactive/reparative change, nonspecific.  He reports he is feeling much much better.  We have been monitoring lab work even since his procedures.  He has been taking budesonide 9 mg daily for the last 9 to 10 weeks.  We wean down pantoprazole from 40 mg to 20 mg.  He has been using MiraLAX initially 17 g twice daily and now once daily.  Maybe 1 or 2 days a week at night he needs Zofran ODT for mild nausea.  He has not needed clonazepam and his duloxetine has been increased to 60 mg daily.  Physically he is feeling much better.  Appetite is better.  He is eating a protein shake daily as well as ice cream on a daily basis.  Bowel movements are "terrific" once occasionally twice per day.  Formed.  Less straining.  No blood.  No mucus.  Much less confusion and disorientation.  Increasing  activity level walking 1.5 miles per day.  Working with physical therapy once a week.  Is thinking about playing tennis again.  Hopes to go to the [facility] after the holiday.  Sleep is better.  Headaches much less.   Review of Systems As per HPI, otherwise negative  Current Medications, Allergies, Past Medical History, Past Surgical History, Family History and Social History were reviewed in [employer] record.    Objective:   Physical Exam BP (!) 160/100   Pulse (!) 148   Ht 5' 10"  (1.778 m)   Wt 149 lb (67.6 kg)   BMI 21.38 kg/m  Gen: awake, alert, NAD HEENT: anicteric Neuro: nonfocal  CMP     Component Value Date/Time   NA 132 (L) 12/14/2021 1345   K 4.3 12/14/2021 1345   CL 93 (L) 12/14/2021 1345   CO2 32 12/14/2021 1345   GLUCOSE 97 12/14/2021 1345   BUN 17 12/14/2021 1345   CREATININE 0.80 12/14/2021 1345   CALCIUM 9.6 12/14/2021 1345   PROT 6.7 12/14/2021 1345   ALBUMIN 4.4 12/14/2021 1345   AST 14 12/14/2021 1345   ALT 13 12/14/2021 1345   ALKPHOS 58 12/14/2021 1345   BILITOT 0.5 12/14/2021 1345      Latest Ref Rng & Units 11/23/2021    8:32 AM 05/22/2015   10:37 AM 11/30/2014   12:16 PM  CBC  WBC 4.0 - 10.5 K/uL 5.8  8.8  10.5   Hemoglobin 13.0 - 17.0 g/dL 14.5  15.3  16.0   Hematocrit 39.0 - 52.0 % 42.5  45.7  47.9   Platelets 150.0 - 400.0 K/uL 244.0  268.0  307.0           Assessment & Plan:  81 year old male with a history of ileal and colonic Crohn's disease (dx 2002), GERD with history of gastritis, B12 and vitamin D deficiency, hypertension, hyperlipidemia, remote prostate cancer status post resection, anxiety and recent hyponatremia who is here for follow-up.   Ileal and colonic Crohn's disease/weight loss --  Ralph Perkins looks and feels much better at this point.  Appetite has improved.  He is picked up weight.  Bowel movements have normalized.  His Crohn's disease was largely subclinical for many years but became active and  leading to negative health consequences over the last 6 or so months.  I also feel that his hyponatremia was secondary to extrarenal GI loss related to Crohn's disease.  Given his improvement we have discussed that I recommended the following: -- Repeating lab work today -- Complete budesonide course and then stop which would be 12 weeks at 9 mg daily; monitor for any change in bowel habit or recurrent abdominal symptoms after stopping this medication.  We could always resume it but if necessary we should consider at least discussed escalation of therapy for more long-term maintenance. -- Continue high-protein diet  2.  Constipation --previous fecal impaction.  Bowel changes likely related to #1.  Much better and continue MiraLAX -- Continue MiraLAX 17 g a day  3.  Hyponatremia --after evaluation most consistent with extrarenal losses likely related to #1.  He has had gradual improvement in serum sodium as his appetite and oral intake has improved.  We will monitor over time -- Check metabolic profile today  4.  Dyspepsia --no longer an issue.  I am going to try to take him off PPI.  Can be resumed if needed -- Discontinue pantoprazole; monitor for the need to resume in the future  5.  Mild nausea --okay for periodic ondansetron 4 mg every 8 hours as needed; he is currently using this 1 or 2 days a week  6.  History of B12 deficiency --continue B12 supplement, repeat B12 today  7.  Anxiety --much improved now that he is taking Cymbalta 60 mg daily.  He has not needed clonazepam.  I encouraged him to continue this current dose of duloxetine  33-monthfollow-up with me  40 minutes total spent today including patient facing time, coordination of care, reviewing medical history/procedures/pertinent radiology studies, and documentation of the encounter.

## 2022-01-17 ENCOUNTER — Ambulatory Visit: Payer: Medicare PPO | Admitting: Psychology

## 2022-01-17 ENCOUNTER — Other Ambulatory Visit: Payer: Self-pay

## 2022-01-17 DIAGNOSIS — K5080 Crohn's disease of both small and large intestine without complications: Secondary | ICD-10-CM

## 2022-01-24 ENCOUNTER — Ambulatory Visit: Payer: Medicare PPO | Admitting: Psychology

## 2022-01-31 ENCOUNTER — Ambulatory Visit: Payer: Medicare PPO | Admitting: Psychology

## 2022-02-01 DIAGNOSIS — R5383 Other fatigue: Secondary | ICD-10-CM | POA: Diagnosis not present

## 2022-02-01 DIAGNOSIS — F411 Generalized anxiety disorder: Secondary | ICD-10-CM | POA: Diagnosis not present

## 2022-02-01 DIAGNOSIS — Z1331 Encounter for screening for depression: Secondary | ICD-10-CM | POA: Diagnosis not present

## 2022-02-01 DIAGNOSIS — R2681 Unsteadiness on feet: Secondary | ICD-10-CM | POA: Diagnosis not present

## 2022-02-01 DIAGNOSIS — K219 Gastro-esophageal reflux disease without esophagitis: Secondary | ICD-10-CM | POA: Diagnosis not present

## 2022-02-01 DIAGNOSIS — E785 Hyperlipidemia, unspecified: Secondary | ICD-10-CM | POA: Diagnosis not present

## 2022-02-01 DIAGNOSIS — E871 Hypo-osmolality and hyponatremia: Secondary | ICD-10-CM | POA: Diagnosis not present

## 2022-02-01 DIAGNOSIS — I10 Essential (primary) hypertension: Secondary | ICD-10-CM | POA: Diagnosis not present

## 2022-02-01 DIAGNOSIS — K5010 Crohn's disease of large intestine without complications: Secondary | ICD-10-CM | POA: Diagnosis not present

## 2022-02-01 DIAGNOSIS — Z1339 Encounter for screening examination for other mental health and behavioral disorders: Secondary | ICD-10-CM | POA: Diagnosis not present

## 2022-02-01 DIAGNOSIS — E039 Hypothyroidism, unspecified: Secondary | ICD-10-CM | POA: Diagnosis not present

## 2022-02-01 DIAGNOSIS — R8281 Pyuria: Secondary | ICD-10-CM | POA: Diagnosis not present

## 2022-02-04 DIAGNOSIS — E871 Hypo-osmolality and hyponatremia: Secondary | ICD-10-CM | POA: Diagnosis not present

## 2022-02-07 ENCOUNTER — Ambulatory Visit: Payer: Medicare PPO | Admitting: Psychology

## 2022-02-14 ENCOUNTER — Ambulatory Visit: Payer: Medicare PPO | Admitting: Psychology

## 2022-02-18 NOTE — Telephone Encounter (Signed)
Can defer to PCP

## 2022-02-21 ENCOUNTER — Ambulatory Visit: Payer: Medicare PPO | Admitting: Psychology

## 2022-02-26 DIAGNOSIS — N481 Balanitis: Secondary | ICD-10-CM | POA: Diagnosis not present

## 2022-02-28 ENCOUNTER — Ambulatory Visit: Payer: Medicare PPO | Admitting: Psychology

## 2022-03-07 ENCOUNTER — Ambulatory Visit: Payer: Medicare PPO | Admitting: Psychology

## 2022-03-14 ENCOUNTER — Ambulatory Visit: Payer: Medicare PPO | Admitting: Psychology

## 2022-03-21 ENCOUNTER — Ambulatory Visit: Payer: Medicare PPO | Admitting: Psychology

## 2022-03-26 ENCOUNTER — Encounter: Payer: Self-pay | Admitting: Internal Medicine

## 2022-03-28 ENCOUNTER — Ambulatory Visit: Payer: Medicare PPO | Admitting: Psychology

## 2022-04-01 ENCOUNTER — Telehealth: Payer: Self-pay | Admitting: Internal Medicine

## 2022-04-01 NOTE — Telephone Encounter (Signed)
Pt was to follow-up in 3 mths per last OV note. Wife states they have since gotten a new PCP and pt is doing great, he has gained 12 pounds. They have an appt for labs in April and labs and OV in August all with Dr. Brigitte Pulse PCP. She wants to know if he still needs to schedule an OV with our office. Please advise.

## 2022-04-01 NOTE — Telephone Encounter (Signed)
Great to hear He does not need an appt at this time. I am ok with 6-12 months from last visit with me In person or video virtual at that time is okay

## 2022-04-01 NOTE — Telephone Encounter (Signed)
Pt notified via mychart

## 2022-04-01 NOTE — Telephone Encounter (Signed)
Inbound call from patients wife stating that they have received a letter in the mail that patient needed to schedule an office visit. Patients wife stated she is not sure if the appointment is needed and is requesting a call back to discuss further. Please advise.

## 2022-05-09 DIAGNOSIS — E785 Hyperlipidemia, unspecified: Secondary | ICD-10-CM | POA: Diagnosis not present

## 2022-09-04 DIAGNOSIS — E785 Hyperlipidemia, unspecified: Secondary | ICD-10-CM | POA: Diagnosis not present

## 2022-09-04 DIAGNOSIS — R7989 Other specified abnormal findings of blood chemistry: Secondary | ICD-10-CM | POA: Diagnosis not present

## 2022-09-04 DIAGNOSIS — Z8546 Personal history of malignant neoplasm of prostate: Secondary | ICD-10-CM | POA: Diagnosis not present

## 2022-09-04 DIAGNOSIS — E559 Vitamin D deficiency, unspecified: Secondary | ICD-10-CM | POA: Diagnosis not present

## 2022-09-04 DIAGNOSIS — E039 Hypothyroidism, unspecified: Secondary | ICD-10-CM | POA: Diagnosis not present

## 2022-09-04 DIAGNOSIS — I10 Essential (primary) hypertension: Secondary | ICD-10-CM | POA: Diagnosis not present

## 2022-09-04 DIAGNOSIS — E871 Hypo-osmolality and hyponatremia: Secondary | ICD-10-CM | POA: Diagnosis not present

## 2022-09-11 ENCOUNTER — Other Ambulatory Visit: Payer: Self-pay | Admitting: Internal Medicine

## 2022-09-11 DIAGNOSIS — K5010 Crohn's disease of large intestine without complications: Secondary | ICD-10-CM | POA: Diagnosis not present

## 2022-09-11 DIAGNOSIS — Z0000 Encounter for general adult medical examination without abnormal findings: Secondary | ICD-10-CM | POA: Diagnosis not present

## 2022-09-11 DIAGNOSIS — I10 Essential (primary) hypertension: Secondary | ICD-10-CM | POA: Diagnosis not present

## 2022-09-11 DIAGNOSIS — R7301 Impaired fasting glucose: Secondary | ICD-10-CM | POA: Diagnosis not present

## 2022-09-11 DIAGNOSIS — E222 Syndrome of inappropriate secretion of antidiuretic hormone: Secondary | ICD-10-CM | POA: Diagnosis not present

## 2022-09-11 DIAGNOSIS — R82998 Other abnormal findings in urine: Secondary | ICD-10-CM | POA: Diagnosis not present

## 2022-09-11 DIAGNOSIS — E785 Hyperlipidemia, unspecified: Secondary | ICD-10-CM

## 2022-09-11 DIAGNOSIS — E871 Hypo-osmolality and hyponatremia: Secondary | ICD-10-CM | POA: Diagnosis not present

## 2022-09-11 DIAGNOSIS — Z8546 Personal history of malignant neoplasm of prostate: Secondary | ICD-10-CM | POA: Diagnosis not present

## 2022-09-11 DIAGNOSIS — E039 Hypothyroidism, unspecified: Secondary | ICD-10-CM | POA: Diagnosis not present

## 2022-09-13 DIAGNOSIS — M7022 Olecranon bursitis, left elbow: Secondary | ICD-10-CM | POA: Diagnosis not present

## 2022-09-13 DIAGNOSIS — M25422 Effusion, left elbow: Secondary | ICD-10-CM | POA: Diagnosis not present

## 2022-09-19 DIAGNOSIS — H40013 Open angle with borderline findings, low risk, bilateral: Secondary | ICD-10-CM | POA: Diagnosis not present

## 2022-09-19 DIAGNOSIS — H2513 Age-related nuclear cataract, bilateral: Secondary | ICD-10-CM | POA: Diagnosis not present

## 2022-09-19 DIAGNOSIS — H25043 Posterior subcapsular polar age-related cataract, bilateral: Secondary | ICD-10-CM | POA: Diagnosis not present

## 2022-09-19 DIAGNOSIS — H5203 Hypermetropia, bilateral: Secondary | ICD-10-CM | POA: Diagnosis not present

## 2022-09-19 DIAGNOSIS — H25013 Cortical age-related cataract, bilateral: Secondary | ICD-10-CM | POA: Diagnosis not present

## 2022-10-08 ENCOUNTER — Ambulatory Visit
Admission: RE | Admit: 2022-10-08 | Discharge: 2022-10-08 | Disposition: A | Discharge status: Home / Self Care | Payer: No Typology Code available for payment source | Source: Ambulatory Visit | Attending: Internal Medicine | Admitting: Internal Medicine

## 2022-10-08 DIAGNOSIS — E785 Hyperlipidemia, unspecified: Secondary | ICD-10-CM

## 2022-10-23 ENCOUNTER — Telehealth: Payer: Self-pay | Admitting: Internal Medicine

## 2022-10-23 NOTE — Telephone Encounter (Signed)
Inbound call from patient's wife, requesting an appointment with Dr. Rhea Belton. States patient is having sharp pains on his side and would like to come in soon for an appointment. Patient was advised soonest appointment with Dr. Rhea Belton would be in Jan. Patient's wife then stated they were friends with him and If a message could be sent to him for something sooner. Please advise.

## 2022-10-24 ENCOUNTER — Encounter: Payer: Self-pay | Admitting: Nurse Practitioner

## 2022-10-24 ENCOUNTER — Other Ambulatory Visit: Payer: Medicare PPO

## 2022-10-24 ENCOUNTER — Ambulatory Visit: Payer: Medicare PPO | Admitting: Nurse Practitioner

## 2022-10-24 ENCOUNTER — Ambulatory Visit (HOSPITAL_BASED_OUTPATIENT_CLINIC_OR_DEPARTMENT_OTHER)
Admission: RE | Admit: 2022-10-24 | Discharge: 2022-10-24 | Disposition: A | Discharge status: Home / Self Care | Payer: Medicare PPO | Source: Ambulatory Visit | Attending: Nurse Practitioner | Admitting: Nurse Practitioner

## 2022-10-24 VITALS — BP 142/80 | HR 100 | Ht 70.0 in | Wt 166.5 lb

## 2022-10-24 DIAGNOSIS — K4090 Unilateral inguinal hernia, without obstruction or gangrene, not specified as recurrent: Secondary | ICD-10-CM

## 2022-10-24 DIAGNOSIS — K5750 Diverticulosis of both small and large intestine without perforation or abscess without bleeding: Secondary | ICD-10-CM | POA: Diagnosis not present

## 2022-10-24 DIAGNOSIS — K5080 Crohn's disease of both small and large intestine without complications: Secondary | ICD-10-CM | POA: Insufficient documentation

## 2022-10-24 DIAGNOSIS — K5090 Crohn's disease, unspecified, without complications: Secondary | ICD-10-CM | POA: Diagnosis not present

## 2022-10-24 DIAGNOSIS — C61 Malignant neoplasm of prostate: Secondary | ICD-10-CM | POA: Diagnosis not present

## 2022-10-24 LAB — COMPREHENSIVE METABOLIC PANEL
ALT: 19 U/L (ref 0–53)
AST: 17 U/L (ref 0–37)
Albumin: 4.4 g/dL (ref 3.5–5.2)
Alkaline Phosphatase: 55 U/L (ref 39–117)
BUN: 15 mg/dL (ref 6–23)
CO2: 29 mEq/L (ref 19–32)
Calcium: 9.9 mg/dL (ref 8.4–10.5)
Chloride: 96 mEq/L (ref 96–112)
Creatinine, Ser: 0.87 mg/dL (ref 0.40–1.50)
GFR: 81.56 mL/min (ref >60.00)
Glucose, Bld: 140 mg/dL — ABNORMAL HIGH (ref 70–99)
Potassium: 4.5 mEq/L (ref 3.5–5.1)
Sodium: 132 mEq/L — ABNORMAL LOW (ref 135–145)
Total Bilirubin: 0.5 mg/dL (ref 0.2–1.2)
Total Protein: 7.3 g/dL (ref 6.0–8.3)

## 2022-10-24 LAB — CBC WITH DIFFERENTIAL/PLATELET
Basophils Absolute: 0 10*3/uL (ref 0.0–0.1)
Basophils Relative: 0.2 % (ref 0.0–3.0)
Eosinophils Absolute: 0 10*3/uL (ref 0.0–0.7)
Eosinophils Relative: 0.6 % (ref 0.0–5.0)
HCT: 47.6 % (ref 39.0–52.0)
Hemoglobin: 15.8 g/dL (ref 13.0–17.0)
Lymphocytes Relative: 9.6 % — ABNORMAL LOW (ref 12.0–46.0)
Lymphs Abs: 0.7 10*3/uL (ref 0.7–4.0)
MCHC: 33.2 g/dL (ref 30.0–36.0)
MCV: 90.7 fl (ref 78.0–100.0)
Monocytes Absolute: 0.8 10*3/uL (ref 0.1–1.0)
Monocytes Relative: 11.1 % (ref 3.0–12.0)
Neutro Abs: 5.5 10*3/uL (ref 1.4–7.7)
Neutrophils Relative %: 78.5 % — ABNORMAL HIGH (ref 43.0–77.0)
Platelets: 250 10*3/uL (ref 150.0–400.0)
RBC: 5.25 Mil/uL (ref 4.22–5.81)
RDW: 13.5 % (ref 11.5–15.5)
WBC: 7 10*3/uL (ref 4.0–10.5)

## 2022-10-24 MED ORDER — IOHEXOL 300 MG/ML  SOLN
100.0000 mL | Freq: Once | INTRAMUSCULAR | Status: AC | PRN
  Administered 2022-10-24: 100 mL via INTRAVENOUS

## 2022-10-24 NOTE — Telephone Encounter (Signed)
Please let patient or his wife know that he can be seen by Alcide Evener, NP at 11 AM today I will also be involved

## 2022-10-24 NOTE — Telephone Encounter (Signed)
Spoke with pts wife and she is aware of appt.

## 2022-10-24 NOTE — Patient Instructions (Addendum)
You have been scheduled for a CT scan of the abdomen and pelvis at [facility], 1st floor Radiology. You are scheduled on 10/24/22 at 3:00 pm. You should arrive 2 hours prior to your appointment time for registration. (Arrive at 1:00 pm)  The purpose of you drinking the oral contrast is to aid in the visualization of your intestinal tract. The contrast solution may cause some diarrhea. Depending on your individual set of symptoms, you may also receive an intravenous injection of x-ray contrast/dye. Plan on being at [facility] for 45 minutes or longer, depending on the type of exam you are having performed.   If you have any questions regarding your exam or if you need to reschedule, you may call [facility] at [phone] between the hours of 8:00 am and 5:00 pm, Monday-Friday.    Your provider has requested that you go to the basement level for lab work before leaving today. Press "B" on the elevator. The lab is located at the first door on the left as you exit the elevator.  We have sent over a referral for you to General Surgery.  Due to recent changes in healthcare laws, you may see the results of your imaging and laboratory studies on MyChart before your provider has had a chance to review them.  We understand that in some cases there may be results that are confusing or concerning to you. Not all laboratory results come back in the same time frame and the provider may be waiting for multiple results in order to interpret others.  Please give [place] 48 hours in order for your provider to thoroughly review all the results before contacting the office for clarification of your results.   Thank you for trusting me with your gastrointestinal care!   Alcide Evener, CRNP

## 2022-10-24 NOTE — Progress Notes (Signed)
Addendum: Reviewed and agree with assessment and management plan. Symptomatic left inguinal hernia.  Agree with imaging and surgical consultation. Thanks to [employer] for seeing him urgently today. Hanya Guerin, Carie Caddy, MD

## 2022-10-24 NOTE — Progress Notes (Signed)
10/24/2022 Ralph Perkins [id number] [birth year]   Chief Complaint: LLQ pain  History of Present Illness: Ralph Perkins is an 82 year old male with a past medical history of ileal and colonic Crohn's disease (dx 2002), GERD with history of gastritis, B12 and vitamin D deficiency, hypertension, hyperlipidemia, remote prostate cancer status post resection, anxiety and hyponatremia. He is known by Dr. Rhea Belton. He developed swelling to the LLQ 4 to 5 days ago and a few days ago developed a burning pain to the left groin area. Yesterday, when walking home from a tennis match he developed " excruciating" pain to the LLQ, he could barely walk. He went home and rested and his LLQ pain diminished and he was able to sleep all night without any difficulty. He awakened this morning without any significant abdominal pain and a few hours ago had recurrence of LLQ pain which is not severe.  No nausea or vomiting.  No constipation.  He is passing a normal formed brown bowel movement daily, passed a BM earlier this morning.  No rectal bleeding or black stools.  No fevers.  His most recent colonoscopy was 10/31/2021 which showed moderate inflammation with erosions, erythema and deep ulcerations in the terminal ileum with stricturing, normal colonic mucosa with the exception of mild mucosal changes in the very distal rectum, moderate diverticulosis and small hemorrhoids.  Path: Ileal biopsies chronic severely active ileitis; CMV negative.  Right colon biopsies normal.  Left colon biopsy normal.  Rectal biopsy mild reactive/reparative change, nonspecific.  He was prescribed Entocort 9 mg daily for 12 weeks. An EGD was done on the same date which showed patchy mild inflammation in the gastric antrum, medium nonbleeding second portion of the duodenum diverticulum.  Gastric and duodenal biopsies were normal.  Current Outpatient Medications on File Prior to Visit  Medication Sig Dispense Refill   Cholecalciferol  (VITAMIN D3) 5000 units CAPS Take 5,000 Units by mouth every evening. (1900)     cyanocobalamin (VITAMIN B12) 1000 MCG tablet Take 1,000 mcg by mouth in the morning.     DULoxetine (CYMBALTA) 60 MG capsule Take 60 mg by mouth at bedtime.     lisinopril (PRINIVIL,ZESTRIL) 20 MG tablet Take 10 mg by mouth in the morning. (0900)     liver oil-zinc oxide (DESITIN) 40 % ointment Apply 1 Application topically as needed for irritation.     polyethylene glycol powder (MIRALAX) 17 GM/SCOOP powder Take 1 Container by mouth daily.     SYNTHROID 75 MCG tablet Take 75 mcg by mouth daily before breakfast.     valACYclovir (VALTREX) 500 MG tablet Take 500 mg by mouth in the morning and at bedtime. (0900 & 1900)     No current facility-administered medications on file prior to visit.   Allergies  Allergen Reactions   Penicillins Other (See Comments)    Childhood reaction.   Current Medications, Allergies, Past Medical History, Past Surgical History, Family History and Social History were reviewed in [employer] record.  Review of Systems:   Constitutional: Negative for fever, sweats, chills or weight loss.  Respiratory: Negative for shortness of breath.   Cardiovascular: Negative for chest pain, palpitations and leg swelling.  Gastrointestinal: See HPI.  Musculoskeletal: Negative for back pain or muscle aches.  Neurological: Negative for dizziness, headaches or paresthesias.   Physical Exam: BP (!) 152/82   Pulse 100   Ht 5\' 10"  (1.778 m)   Wt 166 lb 8 oz (75.5  kg)   SpO2 96%   BMI 23.89 kg/m  General: 82 year old male in no acute distress. Head: Normocephalic and atraumatic. Eyes: No scleral icterus. Conjunctiva pink . Ears: Normal auditory acuity. Mouth: Dentition intact. No ulcers or lesions.  Lungs: Clear throughout to auscultation. Heart: Regular rate and rhythm, no murmur. Abdomen: Soft, nondistended. No masses or hepatomegaly. Normal bowel sounds x 4 quadrants.  Moderate sized left inguinal hernia, semi reducible, mildly tender without rebound or guarding. Small right inguinal hernia. Wife present during exam.  Rectal: Deferred.  Musculoskeletal: Symmetrical with no gross deformities. Extremities: No edema. Neurological: Alert oriented x 4. No focal deficits.  Psychological: Alert and cooperative. Normal mood and affect  Assessment and Recommendations:  36 male with a history of ileal colonic Crohn's disease (not on treatment) who presents with LLQ pain which started 4 to 5 days ago which significantly worsened on 10/23/2022.  Physical exam today showed a moderate sized left inguinal hernia which is semireducible and I suspect he has a small right inguinal hernia. -CBC, CMP -Stat CTAP with oral and IV contrast to rule out any Crohn's related inflammatory process/abscess and to further assess his [facility] -Refer to surgeon Dr. Ivar Drape for left inguinal hernia repair -Patient was instructed to go to the emergency room if he developed worsening LLQ/groin pain, fever, nausea or vomiting -Dr. Rhea Belton discussed the above plan with the patient and spouse  Today's encounter was 25 minutes which included precharting, chart/result review, history/exam, face-to-face time used for counseling, formulating a treatment plan with follow-up and documentation.

## 2022-10-25 ENCOUNTER — Telehealth: Payer: Self-pay | Admitting: Nurse Practitioner

## 2022-10-25 ENCOUNTER — Telehealth: Payer: Self-pay

## 2022-10-25 NOTE — Telephone Encounter (Signed)
Received call from [facility] regarding patient's imaging & they wanted to confirm that provider was able to see results. NP has already addressed the result & notified MD.

## 2022-10-25 NOTE — Telephone Encounter (Signed)
According to patient's chart. He has never been seen at our office. Pt did have a CT, however it was not ordered by our office.  Morrie Sheldon with radiology is aware of above message and voiced her understanding.  Nothing further needed.

## 2022-10-25 NOTE — Telephone Encounter (Signed)
Call report of CT scan; was advised this was stat

## 2022-10-31 ENCOUNTER — Telehealth: Payer: Self-pay | Admitting: Nurse Practitioner

## 2022-10-31 ENCOUNTER — Encounter: Payer: Self-pay | Admitting: Internal Medicine

## 2022-10-31 NOTE — Telephone Encounter (Signed)
Wife called to let us know they had not heard from [facility] regarding referral. Spoke with Kat from [facility] & she stated referral was received and that she would contact patient for appointment. Wife made aware.

## 2022-10-31 NOTE — Telephone Encounter (Signed)
Patients wife called to advise they have not heard from the surgeons office he was referred to and he is still in a lot of pain. Please advise.

## 2022-11-14 DIAGNOSIS — K4020 Bilateral inguinal hernia, without obstruction or gangrene, not specified as recurrent: Secondary | ICD-10-CM | POA: Diagnosis not present

## 2022-11-29 ENCOUNTER — Ambulatory Visit: Payer: Self-pay | Admitting: Surgery

## 2022-11-29 NOTE — Progress Notes (Signed)
Surgery orders requested via Epic inbox. °

## 2022-11-29 NOTE — Patient Instructions (Signed)
SURGICAL WAITING ROOM VISITATION Patients having surgery or a procedure may have no more than 2 support people in the waiting area - these visitors may rotate.    Children under the age of 50 must have an adult with them who is not the patient.  If the patient needs to stay at the hospital during part of their recovery, the visitor guidelines for inpatient rooms apply. Pre-op nurse will coordinate an appropriate time for 1 support person to accompany patient in pre-op.  This support person may not rotate.    Please refer to the [facility] website for the visitor guidelines for Inpatients (after your surgery is over and you are in a regular room).       Your procedure is scheduled on: 12-09-22   Report to [facility] Main Entrance    Report to admitting at 1:15 PM   Call this number if you have problems the morning of surgery [phone]   Do not eat food :After Midnight.   After Midnight you may have the following liquids until 12:30 PM DAY OF SURGERY  Water Non-Citrus Juices (without pulp, NO RED-Apple, White grape, White cranberry) Black Coffee (NO MILK/CREAM OR CREAMERS, sugar ok)  Clear Tea (NO MILK/CREAM OR CREAMERS, sugar ok) regular and decaf                             Plain Jell-O (NO RED)                                           Fruit ices (not with fruit pulp, NO RED)                                     Popsicles (NO RED)                                                               Sports drinks like Gatorade (NO RED)                   The day of surgery:  Drink ONE (1) Pre-Surgery Clear Ensure by 12:30 PM the morning of surgery. Drink in one sitting. Do not sip.  This drink was given to you during your hospital  pre-op appointment visit. Nothing else to drink after completing the Pre-Surgery Clear Ensure.          If you have questions, please contact your surgeon's office.   FOLLOW  ANY ADDITIONAL PRE OP INSTRUCTIONS YOU RECEIVED FROM YOUR  SURGEON'S OFFICE!!!     Oral Hygiene is also important to reduce your risk of infection.                                    Remember - BRUSH YOUR TEETH THE MORNING OF SURGERY WITH YOUR REGULAR TOOTHPASTE   Do NOT smoke after Midnight   Take these medicines the morning of surgery with A SIP OF WATER:   Synthroid  Valacyclovir  Tylenol  if needed  Stop all vitamins and herbal supplements 7 days before surgery  Bring CPAP mask and tubing day of surgery.                              You may not have any metal on your body including  jewelry, and body piercing             Do not wear lotions, powders, cologne, or deodorant              Men may shave face and neck.   Do not bring valuables to the hospital. [facility] IS NOT RESPONSIBLE   FOR VALUABLES.   Contacts, dentures or bridgework may not be worn into surgery.  DO NOT BRING YOUR HOME MEDICATIONS TO THE HOSPITAL. PHARMACY WILL DISPENSE MEDICATIONS LISTED ON YOUR MEDICATION LIST TO YOU DURING YOUR ADMISSION IN THE HOSPITAL!    Patients discharged on the day of surgery will not be allowed to drive home.  Someone NEEDS to stay with you for the first 24 hours after anesthesia.   Special Instructions: Bring a copy of your healthcare power of attorney and living will documents the day of surgery if you haven't scanned them before.              Please read over the following fact sheets you were given: IF YOU HAVE QUESTIONS ABOUT YOUR PRE-OP INSTRUCTIONS PLEASE CALL [phone] G[facility]  If you received a COVID test during your pre-op visit  it is requested that you wear a mask when out in public, stay away from anyone that may not be feeling well and notify your surgeon if you develop symptoms. If you test positive for Covid or have been in contact with anyone that has tested positive in the last 10 days please notify you surgeon.  [facility] - Preparing for Surgery Before surgery, you can play an important role.  Because skin is not  sterile, your skin needs to be as free of germs as possible.  You can reduce the number of germs on your skin by washing with CHG (chlorahexidine gluconate) soap before surgery.  CHG is an antiseptic cleaner which kills germs and bonds with the skin to continue killing germs even after washing. Please DO NOT use if you have an allergy to CHG or antibacterial soaps.  If your skin becomes reddened/irritated stop using the CHG and inform your nurse when you arrive at Short Stay. Do not shave (including legs and underarms) for at least 48 hours prior to the first CHG shower.  You may shave your face/neck.  Please follow these instructions carefully:  1.  Shower with CHG Soap the night before surgery and the  morning of surgery.  2.  If you choose to wash your hair, wash your hair first as usual with your normal  shampoo.  3.  After you shampoo, rinse your hair and body thoroughly to remove the shampoo.                             4.  Use CHG as you would any other liquid soap.  You can apply chg directly to the skin and wash.  Gently with a scrungie or clean washcloth.  5.  Apply the CHG Soap to your body ONLY FROM THE NECK DOWN.   Do   not use on face/ open  Wound or open sores. Avoid contact with eyes, ears mouth and   genitals (private parts).                       Wash face,  Genitals (private parts) with your normal soap.             6.  Wash thoroughly, paying special attention to the area where your    surgery  will be performed.  7.  Thoroughly rinse your body with warm water from the neck down.  8.  DO NOT shower/wash with your normal soap after using and rinsing off the CHG Soap.                9.  Pat yourself dry with a clean towel.            10.  Wear clean pajamas.            11.  Place clean sheets on your bed the night of your first shower and do not  sleep with pets. Day of Surgery : Do not apply any lotions/deodorants the morning of surgery.  Please wear  clean clothes to the hospital/surgery center.  FAILURE TO FOLLOW THESE INSTRUCTIONS MAY RESULT IN THE CANCELLATION OF YOUR SURGERY  PATIENT SIGNATURE_________________________________  NURSE SIGNATURE__________________________________  ________________________________________________________________________

## 2022-12-02 NOTE — Progress Notes (Signed)
COVID Vaccine Completed:  Date of COVID positive in last 90 days:  PCP - Martha Clan, MD Cardiologist -   Chest x-ray -  EKG -  Stress Test -  ECHO -  Cardiac Cath -  Pacemaker/ICD device last checked: Spinal Cord Stimulator: Cardiac CT 10-08-22 Epic  Bowel Prep -   Sleep Study -  CPAP -   Fasting Blood Sugar -  Checks Blood Sugar _____ times a day  Last dose of GLP1 agonist-  N/A GLP1 instructions:  N/A   Last dose of SGLT-2 inhibitors-  N/A SGLT-2 instructions: N/A   Blood Thinner Instructions:  Time Aspirin Instructions: Last Dose:  Activity level:  Can go up a flight of stairs and perform activities of daily living without stopping and without symptoms of chest pain or shortness of breath.  Able to exercise without symptoms  Unable to go up a flight of stairs without symptoms of     Anesthesia review:   Patient denies shortness of breath, fever, cough and chest pain at PAT appointment  Patient verbalized understanding of instructions that were given to them at the PAT appointment. Patient was also instructed that they will need to review over the PAT instructions again at home before surgery.

## 2022-12-04 ENCOUNTER — Encounter (HOSPITAL_COMMUNITY): Payer: Self-pay

## 2022-12-04 ENCOUNTER — Other Ambulatory Visit: Payer: Self-pay

## 2022-12-04 ENCOUNTER — Encounter (HOSPITAL_COMMUNITY)
Admission: RE | Admit: 2022-12-04 | Discharge: 2022-12-04 | Disposition: A | Discharge status: Home / Self Care | Payer: Medicare PPO | Source: Ambulatory Visit | Attending: Surgery | Admitting: Surgery

## 2022-12-04 VITALS — BP 154/84 | HR 98 | Temp 98.6°F | Resp 16 | Ht 70.0 in | Wt 162.8 lb

## 2022-12-04 DIAGNOSIS — Z01818 Encounter for other preprocedural examination: Secondary | ICD-10-CM | POA: Diagnosis not present

## 2022-12-04 DIAGNOSIS — I10 Essential (primary) hypertension: Secondary | ICD-10-CM | POA: Diagnosis not present

## 2022-12-04 DIAGNOSIS — Z01810 Encounter for preprocedural cardiovascular examination: Secondary | ICD-10-CM | POA: Diagnosis present

## 2022-12-04 DIAGNOSIS — Z01812 Encounter for preprocedural laboratory examination: Secondary | ICD-10-CM | POA: Diagnosis present

## 2022-12-04 HISTORY — DX: Hypothyroidism, unspecified: E03.9

## 2022-12-04 LAB — CBC
HCT: 46.1 % (ref 39.0–52.0)
Hemoglobin: 15.2 g/dL (ref 13.0–17.0)
MCH: 30 pg (ref 26.0–34.0)
MCHC: 33 g/dL (ref 30.0–36.0)
MCV: 91.1 fL (ref 80.0–100.0)
Platelets: 237 10*3/uL (ref 150–400)
RBC: 5.06 MIL/uL (ref 4.22–5.81)
RDW: 13 % (ref 11.5–15.5)
WBC: 6.1 10*3/uL (ref 4.0–10.5)
nRBC: 0 % (ref 0.0–0.2)

## 2022-12-04 LAB — BASIC METABOLIC PANEL
Anion gap: 8 (ref 5–15)
BUN: 17 mg/dL (ref 8–23)
CO2: 27 mmol/L (ref 22–32)
Calcium: 9.5 mg/dL (ref 8.9–10.3)
Chloride: 98 mmol/L (ref 98–111)
Creatinine, Ser: 0.79 mg/dL (ref 0.61–1.24)
GFR, Estimated: >60 mL/min (ref >60)
Glucose, Bld: 137 mg/dL — ABNORMAL HIGH (ref 70–99)
Potassium: 4.3 mmol/L (ref 3.5–5.1)
Sodium: 133 mmol/L — ABNORMAL LOW (ref 135–145)

## 2022-12-09 ENCOUNTER — Encounter (HOSPITAL_COMMUNITY)
Admission: RE | Disposition: A | Discharge status: Home / Self Care | Payer: Self-pay | Source: Home / Self Care | Attending: Surgery

## 2022-12-09 ENCOUNTER — Other Ambulatory Visit: Payer: Self-pay

## 2022-12-09 ENCOUNTER — Ambulatory Visit (HOSPITAL_COMMUNITY)
Admission: RE | Admit: 2022-12-09 | Discharge: 2022-12-10 | Disposition: A | Discharge status: Home / Self Care | Payer: Medicare PPO | Attending: Surgery | Admitting: Surgery

## 2022-12-09 ENCOUNTER — Ambulatory Visit (HOSPITAL_BASED_OUTPATIENT_CLINIC_OR_DEPARTMENT_OTHER): Payer: Medicare PPO | Admitting: Anesthesiology

## 2022-12-09 ENCOUNTER — Ambulatory Visit (HOSPITAL_COMMUNITY): Payer: Medicare PPO | Admitting: Anesthesiology

## 2022-12-09 ENCOUNTER — Encounter (HOSPITAL_COMMUNITY): Payer: Self-pay | Admitting: Surgery

## 2022-12-09 DIAGNOSIS — I10 Essential (primary) hypertension: Secondary | ICD-10-CM | POA: Diagnosis not present

## 2022-12-09 DIAGNOSIS — K4020 Bilateral inguinal hernia, without obstruction or gangrene, not specified as recurrent: Secondary | ICD-10-CM | POA: Diagnosis not present

## 2022-12-09 DIAGNOSIS — Z79899 Other long term (current) drug therapy: Secondary | ICD-10-CM | POA: Insufficient documentation

## 2022-12-09 HISTORY — PX: INGUINAL HERNIA REPAIR: SHX194

## 2022-12-09 SURGERY — REPAIR, HERNIA, INGUINAL, LAPAROSCOPIC
Anesthesia: General | Site: Abdomen | Laterality: Bilateral

## 2022-12-09 MED ORDER — CHLORHEXIDINE GLUCONATE CLOTH 2 % EX PADS: 6.0000 | MEDICATED_PAD | Freq: Once | CUTANEOUS | Status: DC

## 2022-12-09 MED ORDER — OXYCODONE HCL 5 MG/5ML PO SOLN: 5.0000 mg | Freq: Once | ORAL | Status: DC | PRN

## 2022-12-09 MED ORDER — OXYCODONE-ACETAMINOPHEN 5-325 MG PO TABS: 1.0000 | ORAL_TABLET | ORAL | 0 refills | Status: DC | PRN

## 2022-12-09 MED ORDER — BUPIVACAINE LIPOSOME 1.3 % IJ SUSP
INTRAMUSCULAR | Status: DC | PRN
  Administered 2022-12-09: 50 mL

## 2022-12-09 MED ORDER — CLINDAMYCIN PHOSPHATE 900 MG/50ML IV SOLN
900.0000 mg | INTRAVENOUS | Status: AC
  Administered 2022-12-09: 900 mg via INTRAVENOUS
  Filled 2022-12-09: qty 50

## 2022-12-09 MED ORDER — BUPIVACAINE HCL (PF) 0.25 % IJ SOLN
INTRAMUSCULAR | Status: AC
  Filled 2022-12-09: qty 30

## 2022-12-09 MED ORDER — ACETAMINOPHEN 325 MG PO TABS
650.0000 mg | ORAL_TABLET | Freq: Four times a day (QID) | ORAL | Status: DC
  Administered 2022-12-09 – 2022-12-10 (×2): 650 mg via ORAL
  Filled 2022-12-09 (×2): qty 2

## 2022-12-09 MED ORDER — LABETALOL HCL 5 MG/ML IV SOLN
INTRAVENOUS | Status: AC
  Filled 2022-12-09: qty 4

## 2022-12-09 MED ORDER — DEXAMETHASONE SODIUM PHOSPHATE 10 MG/ML IJ SOLN
INTRAMUSCULAR | Status: DC | PRN
  Administered 2022-12-09: 8 mg via INTRAVENOUS

## 2022-12-09 MED ORDER — ORAL CARE MOUTH RINSE: 15.0000 mL | Freq: Once | OROMUCOSAL | Status: DC

## 2022-12-09 MED ORDER — ONDANSETRON HCL 4 MG/2ML IJ SOLN
INTRAMUSCULAR | Status: DC | PRN
  Administered 2022-12-09: 4 mg via INTRAVENOUS

## 2022-12-09 MED ORDER — GABAPENTIN 300 MG PO CAPS
300.0000 mg | ORAL_CAPSULE | Freq: Three times a day (TID) | ORAL | Status: DC
  Administered 2022-12-09 – 2022-12-10 (×2): 300 mg via ORAL
  Filled 2022-12-09 (×2): qty 1

## 2022-12-09 MED ORDER — FENTANYL CITRATE (PF) 100 MCG/2ML IJ SOLN
INTRAMUSCULAR | Status: DC | PRN
  Administered 2022-12-09: 100 ug via INTRAVENOUS

## 2022-12-09 MED ORDER — ROCURONIUM BROMIDE 100 MG/10ML IV SOLN
INTRAVENOUS | Status: DC | PRN
  Administered 2022-12-09: 60 mg via INTRAVENOUS
  Administered 2022-12-09: 10 mg via INTRAVENOUS

## 2022-12-09 MED ORDER — ROCURONIUM BROMIDE 10 MG/ML (PF) SYRINGE
PREFILLED_SYRINGE | INTRAVENOUS | Status: AC
  Filled 2022-12-09: qty 10

## 2022-12-09 MED ORDER — OXYCODONE HCL 5 MG PO TABS: 5.0000 mg | ORAL_TABLET | ORAL | Status: DC | PRN

## 2022-12-09 MED ORDER — GABAPENTIN 300 MG PO CAPS
300.0000 mg | ORAL_CAPSULE | ORAL | Status: AC
  Administered 2022-12-09: 300 mg via ORAL
  Filled 2022-12-09: qty 1

## 2022-12-09 MED ORDER — PROCHLORPERAZINE EDISYLATE 10 MG/2ML IJ SOLN: 10.0000 mg | INTRAMUSCULAR | Status: DC | PRN

## 2022-12-09 MED ORDER — GABAPENTIN 600 MG PO TABS: 300.0000 mg | ORAL_TABLET | Freq: Three times a day (TID) | ORAL | Status: DC

## 2022-12-09 MED ORDER — ACETAMINOPHEN 500 MG PO TABS
1000.0000 mg | ORAL_TABLET | ORAL | Status: AC
  Administered 2022-12-09: 1000 mg via ORAL
  Filled 2022-12-09: qty 2

## 2022-12-09 MED ORDER — HYDROMORPHONE HCL 1 MG/ML IJ SOLN: 0.5000 mg | INTRAMUSCULAR | Status: DC | PRN

## 2022-12-09 MED ORDER — CHLORHEXIDINE GLUCONATE 0.12 % MT SOLN
15.0000 mL | Freq: Once | OROMUCOSAL | Status: AC
  Administered 2022-12-09: 15 mL via OROMUCOSAL

## 2022-12-09 MED ORDER — BUPIVACAINE LIPOSOME 1.3 % IJ SUSP
INTRAMUSCULAR | Status: AC
  Filled 2022-12-09: qty 20

## 2022-12-09 MED ORDER — OXYCODONE HCL 5 MG PO TABS: 5.0000 mg | ORAL_TABLET | Freq: Once | ORAL | Status: DC | PRN

## 2022-12-09 MED ORDER — ONDANSETRON HCL 4 MG/2ML IJ SOLN
INTRAMUSCULAR | Status: AC
  Filled 2022-12-09: qty 2

## 2022-12-09 MED ORDER — ENOXAPARIN SODIUM 40 MG/0.4ML IJ SOSY
40.0000 mg | PREFILLED_SYRINGE | Freq: Once | INTRAMUSCULAR | Status: AC
  Administered 2022-12-09: 40 mg via SUBCUTANEOUS
  Filled 2022-12-09: qty 0.4

## 2022-12-09 MED ORDER — DEXAMETHASONE SODIUM PHOSPHATE 10 MG/ML IJ SOLN
INTRAMUSCULAR | Status: AC
  Filled 2022-12-09: qty 1

## 2022-12-09 MED ORDER — HYDROMORPHONE HCL 1 MG/ML IJ SOLN: 0.2500 mg | INTRAMUSCULAR | Status: DC | PRN

## 2022-12-09 MED ORDER — 0.9 % SODIUM CHLORIDE (POUR BTL) OPTIME
TOPICAL | Status: DC | PRN
  Administered 2022-12-09: 1000 mL

## 2022-12-09 MED ORDER — BUPIVACAINE LIPOSOME 1.3 % IJ SUSP: 20.0000 mL | Freq: Once | INTRAMUSCULAR | Status: DC

## 2022-12-09 MED ORDER — LIDOCAINE HCL (CARDIAC) PF 100 MG/5ML IV SOSY
PREFILLED_SYRINGE | INTRAVENOUS | Status: DC | PRN
  Administered 2022-12-09: 80 mg via INTRAVENOUS

## 2022-12-09 MED ORDER — SUGAMMADEX SODIUM 200 MG/2ML IV SOLN
INTRAVENOUS | Status: DC | PRN
  Administered 2022-12-09: 200 mg via INTRAVENOUS

## 2022-12-09 MED ORDER — ONDANSETRON HCL 4 MG/2ML IJ SOLN: 4.0000 mg | Freq: Once | INTRAMUSCULAR | Status: DC | PRN

## 2022-12-09 MED ORDER — ORAL CARE MOUTH RINSE: 15.0000 mL | Freq: Once | OROMUCOSAL | Status: AC

## 2022-12-09 MED ORDER — PHENYLEPHRINE HCL-NACL 20-0.9 MG/250ML-% IV SOLN
INTRAVENOUS | Status: DC | PRN
  Administered 2022-12-09: 160 ug via INTRAVENOUS
  Administered 2022-12-09: 40 ug/min via INTRAVENOUS
  Administered 2022-12-09: 80 ug via INTRAVENOUS

## 2022-12-09 MED ORDER — FENTANYL CITRATE (PF) 100 MCG/2ML IJ SOLN
INTRAMUSCULAR | Status: AC
  Filled 2022-12-09: qty 2

## 2022-12-09 MED ORDER — PROPOFOL 10 MG/ML IV BOLUS
INTRAVENOUS | Status: AC
  Filled 2022-12-09: qty 20

## 2022-12-09 MED ORDER — METHOCARBAMOL 1000 MG/10ML IJ SOLN: 500.0000 mg | Freq: Four times a day (QID) | INTRAMUSCULAR | Status: DC | PRN

## 2022-12-09 MED ORDER — LACTATED RINGERS IV SOLN
INTRAVENOUS | Status: DC
Start: 2022-12-09 — End: 2022-12-09

## 2022-12-09 MED ORDER — DOCUSATE SODIUM 100 MG PO CAPS
100.0000 mg | ORAL_CAPSULE | Freq: Two times a day (BID) | ORAL | Status: DC
  Administered 2022-12-09 – 2022-12-10 (×2): 100 mg via ORAL
  Filled 2022-12-09 (×2): qty 1

## 2022-12-09 MED ORDER — CHLORHEXIDINE GLUCONATE 0.12 % MT SOLN: 15.0000 mL | Freq: Once | OROMUCOSAL | Status: DC

## 2022-12-09 MED ORDER — LABETALOL HCL 5 MG/ML IV SOLN
INTRAVENOUS | Status: DC | PRN
  Administered 2022-12-09: 5 mg via INTRAVENOUS

## 2022-12-09 MED ORDER — PROPOFOL 1000 MG/100ML IV EMUL
INTRAVENOUS | Status: AC
  Filled 2022-12-09: qty 100

## 2022-12-09 MED ORDER — PROPOFOL 500 MG/50ML IV EMUL
INTRAVENOUS | Status: DC | PRN
  Administered 2022-12-09: 85 ug/kg/min via INTRAVENOUS

## 2022-12-09 MED ORDER — LIDOCAINE HCL (PF) 2 % IJ SOLN
INTRAMUSCULAR | Status: AC
  Filled 2022-12-09: qty 5

## 2022-12-09 MED ORDER — PROPOFOL 10 MG/ML IV BOLUS
INTRAVENOUS | Status: DC | PRN
  Administered 2022-12-09: 120 mg via INTRAVENOUS

## 2022-12-09 SURGICAL SUPPLY — 38 items
ADH SKN CLS APL DERMABOND .7 (GAUZE/BANDAGES/DRESSINGS) ×1
APL PRP STRL LF DISP 70% ISPRP (MISCELLANEOUS) ×1
BAG COUNTER SPONGE SURGICOUNT (BAG) ×1 IMPLANT
BAG SPNG CNTER NS LX DISP (BAG) ×1
CABLE HIGH FREQUENCY MONO STRZ (ELECTRODE) ×1 IMPLANT
CHLORAPREP W/TINT 26 (MISCELLANEOUS) ×1 IMPLANT
DERMABOND ADVANCED .7 DNX12 (GAUZE/BANDAGES/DRESSINGS) ×1 IMPLANT
ELECT REM PT RETURN 15FT ADLT (MISCELLANEOUS) ×1 IMPLANT
GLOVE BIO SURGEON STRL SZ7.5 (GLOVE) ×1 IMPLANT
GLOVE INDICATOR 8.0 STRL GRN (GLOVE) ×1 IMPLANT
GOWN STRL REUS W/ TWL XL LVL3 (GOWN DISPOSABLE) ×1 IMPLANT
GOWN STRL REUS W/TWL XL LVL3 (GOWN DISPOSABLE) ×1
GRASPER SUT TROCAR 14GX15 (MISCELLANEOUS) ×1 IMPLANT
IRRIG SUCT STRYKERFLOW 2 WTIP (MISCELLANEOUS)
IRRIGATION SUCT STRKRFLW 2 WTP (MISCELLANEOUS) IMPLANT
KIT BASIN OR (CUSTOM PROCEDURE TRAY) ×1 IMPLANT
KIT TURNOVER KIT A (KITS) IMPLANT
MARKER SKIN DUAL TIP RULER LAB (MISCELLANEOUS) ×1 IMPLANT
MESH 3DMAX 5X7 LT XLRG (Mesh General) IMPLANT
MESH 3DMAX 5X7 RT XLRG (Mesh General) IMPLANT
NDL INSUFFLATION 14GA 120MM (NEEDLE) ×2 IMPLANT
NEEDLE INSUFFLATION 14GA 120MM (NEEDLE) ×2
RELOAD STAPLE 4.0 BLU F/HERNIA (INSTRUMENTS) ×1 IMPLANT
RELOAD STAPLE 4.8 BLK F/HERNIA (STAPLE) IMPLANT
RELOAD STAPLE HERNIA 4.0 BLUE (INSTRUMENTS) ×1
RELOAD STAPLE HERNIA 4.8 BLK (STAPLE)
SCISSORS LAP 5X35 DISP (ENDOMECHANICALS) ×1 IMPLANT
SET TUBE SMOKE EVAC HIGH FLOW (TUBING) ×1 IMPLANT
SPIKE FLUID TRANSFER (MISCELLANEOUS) IMPLANT
STAPLER HERNIA 12 8.5 360D (INSTRUMENTS) ×1 IMPLANT
SUT MNCRL AB 4-0 PS2 18 (SUTURE) ×1 IMPLANT
SUT VICRYL 0 UR6 27IN ABS (SUTURE) IMPLANT
TOWEL OR 17X26 10 PK STRL BLUE (TOWEL DISPOSABLE) ×1 IMPLANT
TRAY FOL W/BAG SLVR 16FR STRL (SET/KITS/TRAYS/PACK) ×1 IMPLANT
TRAY FOLEY W/BAG SLVR 16FR LF (SET/KITS/TRAYS/PACK) ×1
TRAY LAPAROSCOPIC (CUSTOM PROCEDURE TRAY) ×1 IMPLANT
TROCAR ADV FIXATION 12X100MM (TROCAR) ×1 IMPLANT
TROCAR Z-THREAD OPTICAL 5X100M (TROCAR) ×2 IMPLANT

## 2022-12-09 NOTE — H&P (Signed)
Admitting Physician: Hyman Hopes Elvera Almario  Service: General surgery  CC: hernias  Subjective   HPI: Ralph Perkins. is an 82 y.o. male who is here for hernia repair  Past Medical History:  Diagnosis Date   Adenocarcinoma of prostate, stage 1 (HCC)    Anxiety    Crohn's ileocolitis (HCC) 01/29/2000   Dr Charm Barges   Diverticulum of appendix    periampullary   Duodenitis    peptic   Gastropathy    reactive   Herpes simplex    Hyperlipidemia    Hypertension    Hyponatremia    Hypothyroidism    Internal hemorrhoids     Past Surgical History:  Procedure Laterality Date   BIOPSY  10/31/2021   Procedure: BIOPSY;  Surgeon: Beverley Fiedler, MD;  Location: [facility] ENDOSCOPY;  Service: Gastroenterology;;   COLONOSCOPY WITH PROPOFOL N/A 10/31/2021   Procedure: COLONOSCOPY WITH PROPOFOL;  Surgeon: Beverley Fiedler, MD;  Location: [facility] ENDOSCOPY;  Service: Gastroenterology;  Laterality: N/A;   ESOPHAGOGASTRODUODENOSCOPY (EGD) WITH PROPOFOL N/A 10/31/2021   Procedure: ESOPHAGOGASTRODUODENOSCOPY (EGD) WITH PROPOFOL;  Surgeon: Beverley Fiedler, MD;  Location: [facility] ENDOSCOPY;  Service: Gastroenterology;  Laterality: N/A;   PROSTATE BIOPSY     PROSTATECTOMY     TONSILLECTOMY      Family History  Problem Relation Age of Onset   Diabetes Father    Prostate cancer Brother    Colon polyps Neg Hx    Esophageal cancer Neg Hx    Pancreatic cancer Neg Hx    Stomach cancer Neg Hx     Social:  reports that he has never smoked. He has never used smokeless tobacco. He reports that he does not currently use alcohol. He reports that he does not use drugs.  Allergies:  Allergies  Allergen Reactions   Penicillins Other (See Comments)    Childhood reaction.    Medications: Current Outpatient Medications  Medication Instructions   acetaminophen (TYLENOL) 500-1,000 mg, Oral, Every 6 hours PRN   aspirin EC 81 mg, Oral, Daily, Swallow whole.   cyanocobalamin (VITAMIN B12) 1,000 mcg, Oral, Every morning    DULoxetine (CYMBALTA) 60 mg, Oral, Daily at bedtime   lisinopril (ZESTRIL) 10 mg, Oral, Every morning, (0900)   liver oil-zinc oxide (DESITIN) 40 % ointment 1 Application, Topical, As needed   polyethylene glycol powder (MIRALAX) 17 g, Oral, Every morning   rosuvastatin (CRESTOR) 20 mg, Oral, Every evening   Synthroid 75 mcg, Oral, Daily before breakfast   valACYclovir (VALTREX) 500 mg, Oral, Every morning   Vitamin D3 5,000 Units, Oral, Every evening, (1900)    ROS - all of the below systems have been reviewed with the patient and positives are indicated with bold text General: chills, fever or night sweats Eyes: blurry vision or double vision ENT: epistaxis or sore throat Allergy/Immunology: itchy/watery eyes or nasal congestion Hematologic/Lymphatic: bleeding problems, blood clots or swollen lymph nodes Endocrine: temperature intolerance or unexpected weight changes Breast: new or changing breast lumps or nipple discharge Resp: cough, shortness of breath, or wheezing CV: chest pain or dyspnea on exertion GI: as per HPI GU: dysuria, trouble voiding, or hematuria MSK: joint pain or joint stiffness Neuro: TIA or stroke symptoms Derm: pruritus and skin lesion changes Psych: anxiety and depression  Objective   PE Blood pressure (!) 176/80, pulse 94, temperature 97.7 F (36.5 C), temperature source Oral, resp. rate 16, SpO2 99%. Constitutional: NAD; conversant; no deformities Eyes: Moist conjunctiva; no lid lag; anicteric; PERRL Neck:  Trachea midline; no thyromegaly Lungs: Normal respiratory effort; no tactile fremitus CV: RRR; no palpable thrills; no pitting edema GI: Abd bilateral inguinal hernias; no palpable hepatosplenomegaly MSK: Normal range of motion of extremities; no clubbing/cyanosis Psychiatric: Appropriate affect; alert and oriented x3 Lymphatic: No palpable cervical or axillary lymphadenopathy  No results found for this or any previous visit (from the past 24  hour(s)).  Imaging Orders  No imaging studies ordered today  CT Abd/Pel 10/24/22  1. No acute findings in the abdomen or pelvis. Specifically, no findings to suggest active Crohn's disease. 2. Mild circumferential wall thickening in the terminal ileum with intramural fat density, findings compatible with sequelae of previous/chronic inflammation. There is some apparent luminal narrowing in the terminal ileum with only minimal distention of small bowel proximal to this level. A component of underlying fibrous stricture cannot be excluded although imaging features are similar to prior study. 3. Left groin hernia contains a short segment of sigmoid colon without complicating features. 4. 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 5. Aortic Atherosclerosis (ICD10-I70.0).  Bilateral inguinal hernias on my review of CT    Assessment and Plan   Ralph Perkins. is an 82 y.o. male with non-recurrent bilateral inguinal hernia without obstruction or gangrene  Ralph Perkins has a history of Crohn's disease, and presents with bilateral inguinal hernias. I reviewed his CT scan with representative images above. His Crohn's disease has been well-controlled and considered an active for a while. I recommended laparoscopic bilateral inguinal hernia repair with mesh. We discussed the procedure itself as well as its risk, benefits, and alternatives. He has had a robotic prostatectomy in 2010, so there is a possible need for conversion to open surgery. After full discussion all questions answered the patient granted consent to proceed.    Quentin Ore, MD  [facility] Use [email or web address] to contact on call provider

## 2022-12-09 NOTE — Discharge Instructions (Signed)
 GROIN HERNIA REPAIR POST OPERATIVE INSTRUCTIONS  Thinking Clearly  The anesthesia may cause you to feel different for 1 or 2 days. Do not drive, drink alcohol, or make any big decisions for at least 2 days.  Nutrition When you wake up, you will be able to drink small amounts of liquid. If you do not feel sick, you can slowly advance your diet to regular foods. Continue to drink lots of fluids, usually about 8 to 10 glasses per day. Eat a high-fiber diet so you don't strain during bowel movements. High-Fiber Foods Foods high in fiber include beans, bran cereals and whole-grain breads, peas, dried fruit (figs, apricots, and dates), raspberries, blackberries, strawberries, sweet corn, broccoli, baked potatoes with skin, plums, pears, apples, greens, and nuts. Activity Slowly increase your activity. Be sure to get up and walk every hour or so to prevent blood clots. No heavy lifting or strenuous activity for 4 weeks following surgery to prevent hernias at your incision sites or recurrence of your hernia. It is normal to feel tired. You may need more sleep than usual.  Get your rest but make sure to get up and move around frequently to prevent blood clots and pneumonia.  Work and Return to School You can go back to work when you feel well enough. Discuss the timing with your surgeon. You can usually go back to school or work 1 week or less after an laparoscopic or an open repair. If your work requires heavy lifting or strenuous activity you need to be placed on light duty for 4 weeks following surgery. You can return to gym class, sports or other physical activities 4 weeks after surgery.  Wound Care You may experience significant bruising in the groin including into the scrotum in males.  Rest, elevating the groin and scrotum above the level of the heart, ice and compression with tight fitting underwear can help.  Always wash your hands before and after touching near your incision site. Do  not soak in a bathtub until cleared at your follow up appointment. You may take a shower 24 hours after surgery. A small amount of drainage from the incision is normal. If the drainage is thick and yellow or the site is red, you may have an infection, so call your surgeon. If you have a drain in one of your incisions, it will be taken out in office when the drainage stops. Steri-Strips will fall off in 7 to 10 days or they will be removed during your first office visit. If you have dermabond glue covering over the incision, allow the glue to flake off on its own. Protect the new skin, especially from the sun. The sun can burn and cause darker scarring. Your scar will heal in about 4 to 6 weeks and will become softer and continue to fade over the next year.  The cosmetic appearance of the incisions will improve over the course of the first year after surgery. Sensation around your incision will return in a few weeks or months.  Bowel Movements After intestinal surgery, you may have loose watery stools for several days. If watery diarrhea lasts longer than 3 days, contact your surgeon. Pain medication (narcotics) can cause constipation. Increase the fiber in your diet with high-fiber foods if you are constipated. You can take an over the counter stool softener like Colace to avoid constipation.  Additional over the counter medications can also be used if Colace isn't sufficient (for example, Milk of Magnesia or Miralax).    Pain The amount of pain is different for each person. Some people need only 1 to 3 doses of pain control medication, while others need more. Take alternating doses of tylenol and ibuprofen around the clock for the first five days following surgery.  This will provide a baseline of pain control and help with inflammation.  Take the narcotic pain medication in addition if needed for severe pain.  Contact Your Surgeon at [phone], if you have: Pain that will not go away Pain that  gets worse A fever of more than 101F (38.3C) Repeated vomiting Swelling, redness, bleeding, or bad-smelling drainage from your wound site Strong abdominal pain No bowel movement or unable to pass gas for 3 days Watery diarrhea lasting longer than 3 days  Pain Control The goal of pain control is to minimize pain, keep you moving and help you heal. Your surgical team will work with you on your pain plan. Most often a combination of therapies and medications are used to control your pain. You may also be given medication (local anesthetic) at the surgical site. This may help control your pain for several days. Extreme pain puts extra stress on your body at a time when your body needs to focus on healing. Do not wait until your pain has reached a level "10" or is unbearable before telling your doctor or nurse. It is much easier to control pain before it becomes severe. Following a laparoscopic procedure, pain is sometimes felt in the shoulder. This is due to the gas inserted into your abdomen during the procedure. Moving and walking helps to decrease the gas and the right shoulder pain.  Use the guide below for ways to manage your post-operative pain. Learn more by going to facs.org/safepaincontrol.  How Intense Is My Pain Common Therapies to Feel Better       I hardly notice my pain, and it does not interfere with my activities.  I notice my pain and it distracts me, but I can still do activities (sitting up, walking, standing).  Non-Medication Therapies  Ice (in a bag, applied over clothing at the surgical site), elevation, rest, meditation, massage, distraction (music, TV, play) walking and mild exercise Splinting the abdomen with pillows +  Non-Opioid Medications Acetaminophen (Tylenol) Non-steroidal anti-inflammatory drugs (NSAIDS) Aspirin, Ibuprofen (Motrin, Advil) Naproxen (Aleve) Take these as needed, when you feel pain. Both acetaminophen and NSAIDs help to decrease pain  and swelling (inflammation).      My pain is hard to ignore and is more noticeable even when I rest.  My pain interferes with my usual activities.  Non-Medication Therapies  +  Non-Opioid medications  Take on a regular schedule (around-the-clock) instead of as needed. (For example, Tylenol every 6 hours at 9:00 am, 3:00 pm, 9:00 pm, 3:00 am and Motrin every 6 hours at 12:00 am, 6:00 am, 12:00 pm, 6:00 pm)         I am focused on my pain, and I am not doing my daily activities.  I am groaning in pain, and I cannot sleep. I am unable to do anything.  My pain is as bad as it could be, and nothing else matters.  Non-Medication Therapies  +  Around-the-Clock Non-Opioid Medications  +  Short-acting opioids  Opioids should be used with other medications to manage severe pain. Opioids block pain and give a feeling of euphoria (feel high). Addiction, a serious side effect of opioids, is rare with short-term (a few days) use.  Examples of short-acting opioids   include: Tramadol (Ultram), Hydrocodone (Norco, Vicodin), Hydromorphone (Dilaudid), Oxycodone (Oxycontin)     The above directions have been adapted from the American College [employer] Surgical Patient Education Program.  Please refer to the ACS website if needed: https://[email or web address].org/-/media/files/education/[email or web address].[email or web address]   Ralph Hoey, MD [facility] [address], [address], [place], [place]  [address] ?  [address] [address], [place], [place]   [address] [phone] ? [phone] ? FAX [phone] Web site: [email or web address]  

## 2022-12-09 NOTE — Anesthesia Preprocedure Evaluation (Addendum)
Anesthesia Evaluation  Patient identified by MRN, date of birth, ID band Patient awake    Reviewed: Allergy & Precautions, NPO status , Patient's Chart, lab work & pertinent test results  Airway Mallampati: II       Dental no notable dental hx. (+) Teeth Intact, Dental Advisory Given   Pulmonary neg pulmonary ROS   Pulmonary exam normal breath sounds clear to auscultation       Cardiovascular hypertension, Pt. on medications Normal cardiovascular exam Rhythm:Regular Rate:Normal     Neuro/Psych   Anxiety     negative neurological ROS     GI/Hepatic negative GI ROS, Neg liver ROS,,,  Endo/Other  Hypothyroidism  Hyperlipidemia  Renal/GU negative Renal ROS   Prostate Ca    Musculoskeletal Bilateral Inguinal hernias   Abdominal   Peds  Hematology negative hematology ROS (+)   Anesthesia Other Findings   Reproductive/Obstetrics                             Anesthesia Physical Anesthesia Plan  ASA: 2  Anesthesia Plan: General   Post-op Pain Management: Minimal or no pain anticipated   Induction: Intravenous  PONV Risk Score and Plan: 4 or greater and Treatment may vary due to age or medical condition, Ondansetron and Dexamethasone  Airway Management Planned: LMA and Oral ETT  Additional Equipment: None  Intra-op Plan:   Post-operative Plan: Extubation in OR  Informed Consent: I have reviewed the patients History and Physical, chart, labs and discussed the procedure including the risks, benefits and alternatives for the proposed anesthesia with the patient or authorized representative who has indicated his/her understanding and acceptance.     Dental advisory given  Plan Discussed with: CRNA and Anesthesiologist  Anesthesia Plan Comments:         Anesthesia Quick Evaluation

## 2022-12-09 NOTE — Transfer of Care (Signed)
Immediate Anesthesia Transfer of Care Note  Patient: Ralph Perkins.  Procedure(s) Performed: LAPAROSCOPIC BILATERAL INGUINAL HERNIA REPAIR WITH MESH, POSSIBLY OPEN (Bilateral: Abdomen)  Patient Location: PACU  Anesthesia Type:General  Level of Consciousness: sedated  Airway & Oxygen Therapy: Patient Spontanous Breathing and Patient connected to face mask oxygen  Post-op Assessment: Report given to RN and Post -op Vital signs reviewed and stable  Post vital signs: Reviewed and stable  Last Vitals:  Vitals Value Taken Time  BP 107/70 12/09/22 1810  Temp 36.4 C 12/09/22 1756  Pulse 52 12/09/22 1811  Resp 11 12/09/22 1811  SpO2 100 % 12/09/22 1811  Vitals shown include unfiled device data.  Last Pain:  Vitals:   12/09/22 1800  TempSrc:   PainSc: Asleep      Patients Stated Pain Goal: 4 (12/09/22 1330)  Complications: No notable events documented.

## 2022-12-09 NOTE — Anesthesia Postprocedure Evaluation (Signed)
Anesthesia Post Note  Patient: Ralph Perkins.  Procedure(s) Performed: LAPAROSCOPIC BILATERAL INGUINAL HERNIA REPAIR WITH MESH, POSSIBLY OPEN (Bilateral: Abdomen)     Patient location during evaluation: PACU Anesthesia Type: General Level of consciousness: awake and alert and oriented Pain management: pain level controlled Vital Signs Assessment: post-procedure vital signs reviewed and stable Respiratory status: spontaneous breathing, nonlabored ventilation and respiratory function stable Cardiovascular status: blood pressure returned to baseline and stable Postop Assessment: no apparent nausea or vomiting Anesthetic complications: no   No notable events documented.  Last Vitals:  Vitals:   12/09/22 1815 12/09/22 1830  BP: 119/78 132/83  Pulse: (!) 54 61  Resp: 11 18  Temp:    SpO2: 100% 96%    Last Pain:  Vitals:   12/09/22 1830  TempSrc:   PainSc: 0-No pain                 Azarion Hove A.

## 2022-12-09 NOTE — Op Note (Signed)
Patient: Ralph Perkins (Aug 26, 1940, [id number])  Date of Surgery: 12/09/2022  Preoperative Diagnosis: bilateral inguinal hernia   Postoperative Diagnosis: bilateral inguinal hernia   Surgical Procedure: LAPAROSCOPIC BILATERAL INGUINAL HERNIA REPAIR WITH MESH, POSSIBLY OPEN: [id number]   Operative Team Members:  Surgeons and Role:    * Lary Eckardt, Hyman Hopes, MD - Primary   Anesthesiologist: Mal Amabile, MD CRNA: Maurene Capes, CRNA; Nathen May, CRNA   Anesthesia: General   Fluids:  Total I/O In: 150 [I.V.:100; IV Piggyback:50] Out: 50 [Blood:50]  Complications: None  Drains:  None  Specimen: None  Disposition:  PACU - hemodynamically stable.  Plan of Care: Discharge to home after PACU  Indications for Procedure: Ralph Perkins. is a 82 y.o. male who presented with non-recurrent bilateral inguinal hernia without obstruction or gangrene  Ralph Perkins has a history of Crohn's disease, and presents with bilateral inguinal hernias. I reviewed his CT scan with representative images above. His Crohn's disease has been well-controlled and considered an active for a while. I recommended laparoscopic bilateral inguinal hernia repair with mesh. We discussed the procedure itself as well as its risk, benefits, and alternatives. He has had a robotic prostatectomy in 2010, so there is a possible need for conversion to open surgery. After full discussion all questions answered the patient granted consent to proceed.  Findings:  Technique: Transabdominal preperitoneal (TAPP) Hernia Location: Bilateral indirect inguinal hernias Mesh Size &Type:  Bard 3D max extra large right and left meshes Mesh Fixation: Endo-Universal hernia stapler  Scar tissue from previous robotic prostatectomy  Infection status: Patient: Private Patient Elective Case Case: Elective Infection Present At Time Of Surgery (PATOS): None   Description of Procedure:  The patient was positioned  supine, padded and secured to the bed, with both arms tucked.  The abdomen was widely prepped and draped.  A time out procedure was performed.  A 1 cm infraumbilical incision was made.  The abdomen was entered without trauma to the underlying viscera.  The abdomen was insufflated to 15 mm of Hg.  A 12 mm trocar was inserted at the periumbilical incision.  Additional 5 mm trocars were placed in the left and right abdomen.  There was no trauma to the underlying viscera.  There was an indirect hernia on the RIGHT.  Utilizing a transabdominal pre peritoneal technique (TAPP), a horizontal incision was made in the peritoneum, immediately below the umbilicus.  Dissection was carried out in the pre peritoneal space down to the level of the hernia sac which was reduced into the peritoneal cavity completely.  The cord contents were parietalized and preserved.  A large pre peritoneal dissection was performed to uncover the direct, indirect, femoral and obturator spaces.  Cooper's ligament was uncovered medially and the psoas muscle uncovered laterally.  The mesh, as documented above, was opened and advanced into the pre peritoneal position so that it more than adequately covered the indirect, direct, femoral and obturator spaces.  The mesh laid flat, with no inferior folds and covered the entire myopectineal orifice.  The mesh was fixated with the endo-universal hernia stapler to Cooper's ligament and the posterior aspect of the rectus muscle.  The peritoneal flap was closed with the same device.  There were no peritoneal defects or exposed mesh at the conclusion.  There was an indirect hernia on the LEFT.  Utilizing a transabdominal pre peritoneal technique (TAPP), a horizontal incision was made in the peritoneum, immediately below the umbilicus.  Dissection was carried out  in the pre peritoneal space down to the level of the hernia sac which was reduced into the peritoneal cavity completely.  The cord contents were  parietalized and preserved.  A large pre peritoneal dissection was performed to uncover the direct, indirect, femoral and obturator spaces.  Cooper's ligament was uncovered medially and the psoas muscle uncovered laterally.  The mesh, as documented above, was opened and advanced into the pre peritoneal position so that it more than adequately covered the indirect, direct, femoral and obturator spaces.  The mesh laid flat, with no inferior folds and covered the entire myopectineal orifice.  The mesh was fixated with the endo-universal hernia stapler to Cooper's ligament and the posterior aspect of the rectus muscle.  The peritoneal flap was closed with the same device.  There were no peritoneal defects or exposed mesh at the conclusion.  The umbilical trocar was removed and the fascial defect was closed with a 0 Vicryl suture.  The peritoneal cavity was completely desufflated, the trocars removed and the skin closed with 4-0 Monocryl subcuticular suture and skin glue.  All sponge and needle counts were correct at the end of the case.  Ralph Drape, MD General, Bariatric, & Minimally Invasive Surgery [facility], [place]

## 2022-12-09 NOTE — Anesthesia Procedure Notes (Signed)
Procedure Name: Intubation Date/Time: 12/09/2022 4:14 PM  Performed by: Maurene Capes, CRNAPre-anesthesia Checklist: Patient identified, Emergency Drugs available, Suction available and Patient being monitored Patient Re-evaluated:Patient Re-evaluated prior to induction Oxygen Delivery Method: Circle System Utilized Preoxygenation: Pre-oxygenation with 100% oxygen Induction Type: IV induction Ventilation: Mask ventilation without difficulty Laryngoscope Size: Mac and 4 Grade View: Grade II Tube type: Oral Tube size: 7.5 mm Number of attempts: 1 Airway Equipment and Method: Stylet Placement Confirmation: ETT inserted through vocal cords under direct vision, positive ETCO2 and breath sounds checked- equal and bilateral Secured at: 22 cm Tube secured with: Tape Dental Injury: Teeth and Oropharynx as per pre-operative assessment

## 2022-12-10 ENCOUNTER — Encounter (HOSPITAL_COMMUNITY): Payer: Self-pay | Admitting: Surgery

## 2022-12-10 DIAGNOSIS — Z79899 Other long term (current) drug therapy: Secondary | ICD-10-CM | POA: Diagnosis not present

## 2022-12-10 DIAGNOSIS — K4020 Bilateral inguinal hernia, without obstruction or gangrene, not specified as recurrent: Secondary | ICD-10-CM | POA: Diagnosis not present

## 2022-12-10 DIAGNOSIS — I10 Essential (primary) hypertension: Secondary | ICD-10-CM | POA: Diagnosis not present

## 2022-12-10 MED ORDER — LEVOTHYROXINE SODIUM 75 MCG PO TABS
75.0000 ug | ORAL_TABLET | Freq: Every day | ORAL | Status: DC
Start: 2022-12-10 — End: 2022-12-10
  Administered 2022-12-10: 75 ug via ORAL
  Filled 2022-12-10: qty 1

## 2022-12-10 NOTE — Plan of Care (Signed)
  Problem: Education: Goal: Knowledge of General Education information will improve Description: Including pain rating scale, medication(s)/side effects and non-pharmacologic comfort measures Outcome: Progressing   Problem: Health Behavior/Discharge Planning: Goal: Ability to manage health-related needs will improve Outcome: Adequate for Discharge   Problem: Clinical Measurements: Goal: Ability to maintain clinical measurements within normal limits will improve Outcome: Progressing Goal: Will remain free from infection Outcome: Progressing Goal: Diagnostic test results will improve Outcome: Progressing Goal: Respiratory complications will improve Outcome: Progressing Goal: Cardiovascular complication will be avoided Outcome: Progressing   Problem: Activity: Goal: Risk for activity intolerance will decrease Outcome: Progressing   Problem: Nutrition: Goal: Adequate nutrition will be maintained Outcome: Progressing   Problem: Coping: Goal: Level of anxiety will decrease Outcome: Progressing   Problem: Elimination: Goal: Will not experience complications related to bowel motility Outcome: Progressing Goal: Will not experience complications related to urinary retention Outcome: Adequate for Discharge   Problem: Pain Management: Goal: General experience of comfort will improve Outcome: Progressing   Problem: Safety: Goal: Ability to remain free from injury will improve Outcome: Progressing

## 2022-12-10 NOTE — Progress Notes (Signed)
   12/10/22 0850  TOC Brief Assessment  Insurance and Status Reviewed  Patient has primary care physician Yes  Home environment has been reviewed Resides with spouse  Prior level of function: Independent at baseline  Prior/Current Home Services No current home services  Social Determinants of Health Reivew SDOH reviewed no interventions necessary  Readmission risk has been reviewed Yes  Transition of care needs no transition of care needs at this time

## 2022-12-10 NOTE — Plan of Care (Signed)
Discharge instructions given to the patient and his wife including medications and follow up.  

## 2022-12-10 NOTE — Discharge Summary (Signed)
  Patient ID: Ralph Perkins [id number] 82 y.o. 1940/12/22  12/09/2022  Discharge date and time: 12/10/2022  Admitting Physician: Hyman Hopes Sheryll Dymek  Discharge Physician: Hyman Hopes Paulmichael Schreck  Admission Diagnoses: Bilateral inguinal hernia [K40.20] Patient Active Problem List   Diagnosis Date Noted   Bilateral inguinal hernia 12/09/2022   Abnormal CT of the abdomen    Crohn's disease of both small and large intestine without complication (HCC)    Loss of weight    Nausea without vomiting    Change in bowel function      Discharge Diagnoses:  Patient Active Problem List   Diagnosis Date Noted   Bilateral inguinal hernia 12/09/2022   Abnormal CT of the abdomen    Crohn's disease of both small and large intestine without complication (HCC)    Loss of weight    Nausea without vomiting    Change in bowel function     Operations: Procedure(s): LAPAROSCOPIC BILATERAL INGUINAL HERNIA REPAIR WITH MESH, POSSIBLY OPEN  Admission Condition: good  Discharged Condition: good  Indication for Admission: Bilateral inguinal hernias  Hospital Course: Laparoscopic bilateral inguinal hernia repair with mesh  Consults: None  Significant Diagnostic Studies: None  Treatments: surgery: as above  Disposition: Home  Patient Instructions:  Allergies as of 12/10/2022       Reactions   Penicillins Other (See Comments)   Childhood reaction.        Medication List     TAKE these medications    acetaminophen 500 MG tablet Commonly known as: TYLENOL Take 500-1,000 mg by mouth every 6 (six) hours as needed (pain.).   aspirin EC 81 MG tablet Take 81 mg by mouth daily. Swallow whole.   cyanocobalamin 1000 MCG tablet Commonly known as: VITAMIN B12 Take 1,000 mcg by mouth in the morning.   DULoxetine 60 MG capsule Commonly known as: CYMBALTA Take 60 mg by mouth at bedtime.   lisinopril 20 MG tablet Commonly known as: ZESTRIL Take 10 mg by mouth in the morning.  (0900)   liver oil-zinc oxide 40 % ointment Commonly known as: DESITIN Apply 1 Application topically as needed for irritation.   MiraLax 17 GM/SCOOP powder Generic drug: polyethylene glycol powder Take 17 g by mouth in the morning.   oxyCODONE-acetaminophen 5-325 MG tablet Commonly known as: Percocet Take 1 tablet by mouth every 4 (four) hours as needed for severe pain (pain score 7-10).   rosuvastatin 20 MG tablet Commonly known as: CRESTOR Take 20 mg by mouth every evening.   Synthroid 75 MCG tablet Generic drug: levothyroxine Take 75 mcg by mouth daily before breakfast.   valACYclovir 500 MG tablet Commonly known as: VALTREX Take 500 mg by mouth in the morning.   Vitamin D3 125 MCG (5000 UT) Caps Take 5,000 Units by mouth every evening. (1900)        Activity: no heavy lifting for 4 weeks Diet: regular diet Wound Care: keep wound clean and dry  Follow-up:  With Dr. Dossie Der  Signed: Hyman Hopes Eiza Canniff General, Bariatric, & Minimally Invasive Surgery [facility], [place]   12/10/2022, 7:43 AM

## 2023-02-10 ENCOUNTER — Ambulatory Visit: Payer: Medicare PPO | Attending: Cardiology | Admitting: Cardiology

## 2023-02-10 ENCOUNTER — Encounter: Payer: Self-pay | Admitting: *Deleted

## 2023-02-10 VITALS — BP 150/68 | HR 86 | Ht 70.0 in | Wt 163.0 lb

## 2023-02-10 DIAGNOSIS — I2583 Coronary atherosclerosis due to lipid rich plaque: Secondary | ICD-10-CM

## 2023-02-10 DIAGNOSIS — I2510 Atherosclerotic heart disease of native coronary artery without angina pectoris: Secondary | ICD-10-CM

## 2023-02-10 NOTE — Progress Notes (Signed)
 Cardiology Office Note:  .   Date:  02/10/2023  ID:  Ralph Perkins., DOB Jun 23, 1940, MRN [id number] PCP: Loreli Elsie Ralph Perkins., MD  [facility] Providers Cardiologist:  None     History of Present Illness: .   Ralph Perkins. is a 83 y.o. male Discussed the use of AI scribe   History of Present Illness   The patient is an 83 year old male with a history of coronary artery disease (CAC on scan), Crohn's disease, prostate cancer (status post prostatectomy in 2010), hypertension, and hyperlipidemia. The patient's coronary calcium score was recently found to be 1142 (73rd percentile), prompting this consultation. The patient has been on rosuvastatin 20mg  daily and aspirin 81mg  daily for management of his coronary artery disease. He also takes lisinopril 10mg  daily for hypertension.  The patient is a nonsmoker and leads an active lifestyle, engaging in regular walking and tennis. He reports no current symptoms of chest pain, shortness of breath, or other cardiac-related symptoms. However, he does mention a history of white coat syndrome, indicating a potential for elevated blood pressure readings in clinical settings.  The patient also has a history of Crohn's disease, but there is some uncertainty about this diagnosis as he has never exhibited typical symptoms of the condition. He also had a significant drop in sodium levels about a year and a half ago, which required hospitalization and a prolonged recovery period.  The patient's lifestyle is generally healthy, with regular physical activity and a balanced diet. He does not consume coffee or alcohol  regularly. He has a supportive spouse, and he both maintain an active lifestyle, including regular tennis play.          Studies Reviewed: .        Results   RADIOLOGY Coronary Calcium Score: 1142, 73rd percentile  DIAGNOSTIC EKG: Normal     Risk Assessment/Calculations:           Physical Exam:   VS:  BP (!) 150/68 (BP  Location: Right Arm)   Pulse 86   Ht 5' 10 (1.778 m)   Wt 163 lb (73.9 kg)   SpO2 97%   BMI 23.39 kg/m    Wt Readings from Last 3 Encounters:  02/10/23 163 lb (73.9 kg)  12/09/22 162 lb 11.2 oz (73.8 kg)  12/04/22 162 lb 12.8 oz (73.8 kg)    GEN: Well nourished, well developed in no acute distress NECK: No JVD; No carotid bruits CARDIAC: RRR, 2/6 systolic murmur apical, no rubs, no gallops RESPIRATORY:  Clear to auscultation without rales, wheezing or rhonchi  ABDOMEN: Soft, non-tender, non-distended EXTREMITIES:  No edema; No deformity   ASSESSMENT AND PLAN: .    Assessment and Plan    Coronary Artery Disease (CAD) [age]-year-old with a coronary calcium score of 1142 (73rd percentile), indicating significant calcified plaque. Asymptomatic with no chest pain or pressure. Currently on rosuvastatin 20 mg daily and aspirin 81 mg daily. Active lifestyle including walking and tennis. Discussed calcified plaque, rosuvastatin's role in stabilizing plaque, and potential need for stress test to assess ischemia. Informed about risks and benefits of stress test and echocardiogram, including possible invasive testing if high-risk areas are identified. - Order Lexiscan  stress test - Continue rosuvastatin 20 mg daily - Continue aspirin 81 mg daily - Schedule echocardiogram to evaluate heart murmur  Hypertension Hypertension managed with lisinopril 10 mg daily. Blood pressure well-controlled at home, though experiences white coat syndrome. Active and adheres to a healthy lifestyle. - Continue lisinopril  10 mg daily - Monitor blood pressure regularly  Hyperlipidemia Hyperlipidemia managed with rosuvastatin 20 mg daily, effectively lowering LDL cholesterol and stabilizing coronary plaque. - Continue rosuvastatin 20 mg daily with LDL goal <70  Crohn's Disease Crohn's disease with no active symptoms or recent flare-ups. No bleeding issues and tolerates aspirin well. Discussed importance of  monitoring for gastrointestinal bleeding. - Continue current management - Monitor for gastrointestinal bleeding  Prostate Cancer (post-prostatectomy) Post-prostatectomy for prostate cancer in 2010. No current issues or recurrence reported. - Continue routine follow-up with primary care physician  General Health Maintenance Follows a healthy lifestyle with regular physical activity and balanced diet. Discussed Mediterranean diet, resolved low sodium levels, and limited benefits of alcohol  and coffee consumption. - Adopt Mediterranean diet - Avoid processed foods - Limit alcohol  intake - Consult primary care physician regarding coffee consumption  Follow-up - Schedule stress test - Schedule echocardiogram - Will follow up with result of testing - Follow up with primary care physician, Dr. Georgette Gentry.           Informed Consent   Shared Decision Making/Informed Consent The risks [chest pain, shortness of breath, cardiac arrhythmias, dizziness, blood pressure fluctuations, myocardial infarction, stroke/transient ischemic attack, nausea, vomiting, allergic reaction, radiation exposure, metallic taste sensation and life-threatening complications (estimated to be 1 in 10,000)], benefits (risk stratification, diagnosing coronary artery disease, treatment guidance) and alternatives of a nuclear stress test were discussed in detail with [name] and he agrees to proceed.      Signed, Oneil Parchment, MD

## 2023-02-10 NOTE — Patient Instructions (Signed)
 Medication Instructions:  The current medical regimen is effective;  continue present plan and medications.  *If you need a refill on your cardiac medications before your next appointment, please call your pharmacy*  Testing/Procedures: Your physician has requested that you have an echocardiogram. Echocardiography is a painless test that uses sound waves to create images of your heart. It provides your doctor with information about the size and shape of your heart and how well your heart's chambers and valves are working. This procedure takes approximately one hour. There are no restrictions for this procedure. Please do NOT wear cologne, perfume, aftershave, or lotions (deodorant is allowed). Please arrive 15 minutes prior to your appointment time.  Please note: We ask at that you not bring children with you during ultrasound (echo/ vascular) testing. Due to room size and safety concerns, children are not allowed in the ultrasound rooms during exams. Our front office staff cannot provide observation of children in our lobby area while testing is being conducted. An adult accompanying a patient to their appointment will only be allowed in the ultrasound room at the discretion of the ultrasound technician under special circumstances. We apologize for any inconvenience.  Your physician has requested that you have a lexiscan myoview. For further information please visit [email or web address] Please follow instruction sheet, as given.   Follow-Up: At [facility], you and your health needs are our priority.  As part of our continuing mission to provide you with exceptional heart care, we have created designated Provider Care Teams.  These Care Teams include your primary Cardiologist (physician) and Advanced Practice Providers (APPs -  Physician Assistants and Nurse Practitioners) who all work together to provide you with the care you need, when you need it.  We recommend signing up for the  patient portal called "MyChart".  Sign up information is provided on this After Visit Summary.  MyChart is used to connect with patients for Virtual Visits (Telemedicine).  Patients are able to view lab/test results, encounter notes, upcoming appointments, etc.  Non-urgent messages can be sent to your provider as well.   To learn more about what you can do with MyChart, go to [email or web address].    Your next appointment:   Follow up will be based on the results of the above testing.

## 2023-02-18 DIAGNOSIS — H25042 Posterior subcapsular polar age-related cataract, left eye: Secondary | ICD-10-CM | POA: Diagnosis not present

## 2023-02-18 DIAGNOSIS — H25812 Combined forms of age-related cataract, left eye: Secondary | ICD-10-CM | POA: Diagnosis not present

## 2023-02-18 DIAGNOSIS — Z961 Presence of intraocular lens: Secondary | ICD-10-CM | POA: Diagnosis not present

## 2023-02-18 DIAGNOSIS — H25012 Cortical age-related cataract, left eye: Secondary | ICD-10-CM | POA: Diagnosis not present

## 2023-02-18 DIAGNOSIS — H2512 Age-related nuclear cataract, left eye: Secondary | ICD-10-CM | POA: Diagnosis not present

## 2023-02-24 ENCOUNTER — Encounter (HOSPITAL_COMMUNITY): Payer: Self-pay

## 2023-03-05 ENCOUNTER — Ambulatory Visit (HOSPITAL_COMMUNITY): Payer: Medicare PPO | Attending: Internal Medicine

## 2023-03-05 ENCOUNTER — Ambulatory Visit (HOSPITAL_BASED_OUTPATIENT_CLINIC_OR_DEPARTMENT_OTHER): Payer: Medicare PPO

## 2023-03-05 DIAGNOSIS — I2510 Atherosclerotic heart disease of native coronary artery without angina pectoris: Secondary | ICD-10-CM | POA: Diagnosis not present

## 2023-03-05 DIAGNOSIS — I2583 Coronary atherosclerosis due to lipid rich plaque: Secondary | ICD-10-CM

## 2023-03-05 LAB — MYOCARDIAL PERFUSION IMAGING
Base ST Depression (mm): 0 mm
LV dias vol: 89 mL (ref 62–150)
LV sys vol: 31 mL
Nuc Stress EF: 65 %
Peak HR: 97 {beats}/min
Rest HR: 75 {beats}/min
Rest Nuclear Isotope Dose: 10.4 mCi
SDS: 1
SRS: 0
SSS: 1
ST Depression (mm): 0.5 mm
Stress Nuclear Isotope Dose: 32.6 mCi
TID: 1.19

## 2023-03-05 LAB — ECHOCARDIOGRAM COMPLETE
Area-P 1/2: 3.63 cm2
Height: 70 in
S' Lateral: 2.8 cm
Weight: 2608 [oz_av]

## 2023-03-05 MED ORDER — REGADENOSON 0.4 MG/5ML IV SOLN
0.4000 mg | Freq: Once | INTRAVENOUS | Status: AC
  Administered 2023-03-05: 0.4 mg via INTRAVENOUS

## 2023-03-05 MED ORDER — TECHNETIUM TC 99M TETROFOSMIN IV KIT
10.4000 | PACK | Freq: Once | INTRAVENOUS | Status: AC | PRN
  Administered 2023-03-05: 10.4 via INTRAVENOUS

## 2023-03-05 MED ORDER — TECHNETIUM TC 99M TETROFOSMIN IV KIT
32.6000 | PACK | Freq: Once | INTRAVENOUS | Status: AC | PRN
  Administered 2023-03-05: 32.6 via INTRAVENOUS

## 2023-03-06 ENCOUNTER — Encounter: Payer: Self-pay | Admitting: Cardiology

## 2023-04-15 DIAGNOSIS — H2511 Age-related nuclear cataract, right eye: Secondary | ICD-10-CM | POA: Diagnosis not present

## 2023-04-15 DIAGNOSIS — H25011 Cortical age-related cataract, right eye: Secondary | ICD-10-CM | POA: Diagnosis not present

## 2023-04-15 DIAGNOSIS — H25811 Combined forms of age-related cataract, right eye: Secondary | ICD-10-CM | POA: Diagnosis not present

## 2023-04-15 DIAGNOSIS — Z961 Presence of intraocular lens: Secondary | ICD-10-CM | POA: Diagnosis not present

## 2023-04-15 DIAGNOSIS — H25041 Posterior subcapsular polar age-related cataract, right eye: Secondary | ICD-10-CM | POA: Diagnosis not present

## 2023-05-15 ENCOUNTER — Other Ambulatory Visit: Payer: Self-pay | Admitting: Internal Medicine

## 2023-05-15 DIAGNOSIS — R911 Solitary pulmonary nodule: Secondary | ICD-10-CM

## 2023-06-03 ENCOUNTER — Ambulatory Visit
Admission: RE | Admit: 2023-06-03 | Discharge: 2023-06-03 | Disposition: A | Discharge status: Home / Self Care | Source: Ambulatory Visit | Attending: Internal Medicine | Admitting: Internal Medicine

## 2023-06-03 DIAGNOSIS — R911 Solitary pulmonary nodule: Secondary | ICD-10-CM

## 2023-09-30 DIAGNOSIS — E039 Hypothyroidism, unspecified: Secondary | ICD-10-CM | POA: Diagnosis not present

## 2023-09-30 DIAGNOSIS — E291 Testicular hypofunction: Secondary | ICD-10-CM | POA: Diagnosis not present

## 2023-09-30 DIAGNOSIS — I10 Essential (primary) hypertension: Secondary | ICD-10-CM | POA: Diagnosis not present

## 2023-09-30 DIAGNOSIS — E785 Hyperlipidemia, unspecified: Secondary | ICD-10-CM | POA: Diagnosis not present

## 2023-09-30 DIAGNOSIS — E7849 Other hyperlipidemia: Secondary | ICD-10-CM | POA: Diagnosis not present

## 2023-09-30 DIAGNOSIS — E559 Vitamin D deficiency, unspecified: Secondary | ICD-10-CM | POA: Diagnosis not present

## 2023-09-30 DIAGNOSIS — R7301 Impaired fasting glucose: Secondary | ICD-10-CM | POA: Diagnosis not present

## 2023-10-06 DIAGNOSIS — I2510 Atherosclerotic heart disease of native coronary artery without angina pectoris: Secondary | ICD-10-CM | POA: Diagnosis not present

## 2023-10-06 DIAGNOSIS — E222 Syndrome of inappropriate secretion of antidiuretic hormone: Secondary | ICD-10-CM | POA: Diagnosis not present

## 2023-10-06 DIAGNOSIS — E039 Hypothyroidism, unspecified: Secondary | ICD-10-CM | POA: Diagnosis not present

## 2023-10-06 DIAGNOSIS — R911 Solitary pulmonary nodule: Secondary | ICD-10-CM | POA: Diagnosis not present

## 2023-10-06 DIAGNOSIS — Z0000 Encounter for general adult medical examination without abnormal findings: Secondary | ICD-10-CM | POA: Diagnosis not present

## 2023-10-06 DIAGNOSIS — I10 Essential (primary) hypertension: Secondary | ICD-10-CM | POA: Diagnosis not present

## 2023-10-06 DIAGNOSIS — G3184 Mild cognitive impairment, so stated: Secondary | ICD-10-CM | POA: Diagnosis not present

## 2023-10-06 DIAGNOSIS — Z1339 Encounter for screening examination for other mental health and behavioral disorders: Secondary | ICD-10-CM | POA: Diagnosis not present

## 2023-10-06 DIAGNOSIS — Z23 Encounter for immunization: Secondary | ICD-10-CM | POA: Diagnosis not present

## 2023-10-06 DIAGNOSIS — Z1331 Encounter for screening for depression: Secondary | ICD-10-CM | POA: Diagnosis not present

## 2023-10-06 DIAGNOSIS — R82998 Other abnormal findings in urine: Secondary | ICD-10-CM | POA: Diagnosis not present

## 2023-10-06 DIAGNOSIS — K5010 Crohn's disease of large intestine without complications: Secondary | ICD-10-CM | POA: Diagnosis not present

## 2023-10-06 DIAGNOSIS — Z8546 Personal history of malignant neoplasm of prostate: Secondary | ICD-10-CM | POA: Diagnosis not present

## 2023-10-06 DIAGNOSIS — E785 Hyperlipidemia, unspecified: Secondary | ICD-10-CM | POA: Diagnosis not present

## 2023-11-17 DIAGNOSIS — D692 Other nonthrombocytopenic purpura: Secondary | ICD-10-CM | POA: Diagnosis not present

## 2023-11-17 DIAGNOSIS — D1801 Hemangioma of skin and subcutaneous tissue: Secondary | ICD-10-CM | POA: Diagnosis not present

## 2023-11-17 DIAGNOSIS — L821 Other seborrheic keratosis: Secondary | ICD-10-CM | POA: Diagnosis not present

## 2023-11-17 DIAGNOSIS — L812 Freckles: Secondary | ICD-10-CM | POA: Diagnosis not present

## 2023-11-17 DIAGNOSIS — L570 Actinic keratosis: Secondary | ICD-10-CM | POA: Diagnosis not present
